# Patient Record
Sex: Female | Born: 1995 | Race: Black or African American | Hispanic: No | Marital: Single | Smoking: Never smoker
Health system: Southern US, Community
[De-identification: ages and names within clinical notes are randomized; demographics above are authoritative.]

## PROBLEM LIST (undated history)

## (undated) ENCOUNTER — Inpatient Hospital Stay (HOSPITAL_COMMUNITY): Payer: Self-pay

## (undated) DIAGNOSIS — D649 Anemia, unspecified: Secondary | ICD-10-CM

## (undated) DIAGNOSIS — R519 Headache, unspecified: Secondary | ICD-10-CM

## (undated) HISTORY — DX: Headache, unspecified: R51.9

## (undated) HISTORY — DX: Anemia, unspecified: D64.9

## (undated) HISTORY — PX: NO PAST SURGERIES: SHX2092

---

## 2017-06-26 ENCOUNTER — Emergency Department
Admission: EM | Admit: 2017-06-26 | Discharge: 2017-06-26 | Disposition: A | Discharge status: Home / Self Care | Payer: Medicaid Other | Attending: Emergency Medicine | Admitting: Emergency Medicine

## 2017-06-26 ENCOUNTER — Other Ambulatory Visit: Payer: Self-pay

## 2017-06-26 ENCOUNTER — Encounter: Payer: Self-pay | Admitting: Emergency Medicine

## 2017-06-26 DIAGNOSIS — K029 Dental caries, unspecified: Secondary | ICD-10-CM

## 2017-06-26 DIAGNOSIS — K0381 Cracked tooth: Secondary | ICD-10-CM | POA: Insufficient documentation

## 2017-06-26 DIAGNOSIS — K0889 Other specified disorders of teeth and supporting structures: Secondary | ICD-10-CM | POA: Diagnosis not present

## 2017-06-26 DIAGNOSIS — S025XXA Fracture of tooth (traumatic), initial encounter for closed fracture: Secondary | ICD-10-CM

## 2017-06-26 MED ORDER — IBUPROFEN 600 MG PO TABS: 600.0000 mg | ORAL_TABLET | Freq: Four times a day (QID) | ORAL | 0 refills | Status: DC | PRN

## 2017-06-26 MED ORDER — AMOXICILLIN 500 MG PO CAPS: 500.0000 mg | ORAL_CAPSULE | Freq: Three times a day (TID) | ORAL | 0 refills | Status: DC

## 2017-06-26 MED ORDER — LIDOCAINE VISCOUS 2 % MT SOLN: 10.0000 mL | OROMUCOSAL | 0 refills | Status: DC | PRN

## 2017-06-26 NOTE — ED Notes (Signed)
Pt discharged to home.  Family member driving.  Discharge instructions reviewed.  Verbalized understanding.  No questions or concerns at this time.  Teach back verified.  Pt in NAD.  No items left in ED.   

## 2017-06-26 NOTE — ED Triage Notes (Signed)
R upper dental pain began today.

## 2017-06-26 NOTE — ED Provider Notes (Signed)
[facility] Emergency Department Provider Note  ____________________________________________  Time seen: Approximately 3:17 PM  I have reviewed the triage vital signs and the nursing notes.   HISTORY  Chief Complaint Dental Pain    HPI Nancy Shaffer is a 21 y.o. female that presents to the emergency department for evaluation of right and left upper dental pain for 1 day.  Patient states that her left upper molar broke off yesterday. She was not eating anything when it happened.  No trauma.  [place] and does not have a dentist in the area.  She does not remember the last time she saw a dentist.  No fever, difficulty opening and closing mouth, drainage from eyes, nausea, vomiting.  History reviewed. No pertinent past medical history.  There are no active problems to display for this patient.   History reviewed. No pertinent surgical history.  Prior to Admission medications   Medication Sig Start Date End Date Taking? Authorizing Provider  amoxicillin (AMOXIL) 500 MG capsule Take 1 capsule (500 mg total) 3 (three) times daily by mouth. 06/26/17   Enid DerryWagner, Elgin Carn, PA-C  ibuprofen (ADVIL,MOTRIN) 600 MG tablet Take 1 tablet (600 mg total) every 6 (six) hours as needed by mouth. 06/26/17   Enid DerryWagner, Devell Parkerson, PA-C  lidocaine (XYLOCAINE) 2 % solution Use as directed 10 mLs as needed in the mouth or throat for mouth pain. 06/26/17   Enid DerryWagner, Suresh Audi, PA-C    Allergies Patient has no known allergies.  No family history on file.  Social History Social History   Tobacco Use  . Smoking status: Never Smoker  Substance Use Topics  . Alcohol use: Not on file  . Drug use: Not on file     Review of Systems  Constitutional: No fever/chills Cardiovascular: No chest pain. Respiratory: No SOB. Gastrointestinal: No abdominal pain.  No nausea, no vomiting.  Musculoskeletal: Negative for musculoskeletal pain. Skin: Negative for rash,  abrasions, lacerations, ecchymosis. Neurological: Negative for headaches, numbness or tingling   ____________________________________________   PHYSICAL EXAM:  VITAL SIGNS: ED Triage Vitals  Enc Vitals Group     BP 06/26/17 1415 106/71     Pulse Rate 06/26/17 1415 91     Resp 06/26/17 1415 (!) 8     Temp 06/26/17 1415 98.7 F (37.1 C)     Temp Source 06/26/17 1415 Oral     SpO2 06/26/17 1415 100 %     Weight 06/26/17 1416 183 lb (83 kg)     Height 06/26/17 1416 5\' 3"  (1.6 m)     Head Circumference --      Peak Flow --      Pain Score 06/26/17 1415 8     Pain Loc --      Pain Edu? --      Excl. in GC? --      Constitutional: Alert and oriented. Well appearing and in no acute distress. Eyes: Conjunctivae are normal. PERRL. EOMI. Head: Atraumatic. ENT:      Ears:      Nose: No congestion/rhinnorhea.      Mouth/Throat: Mucous membranes are moist.  Large cavities and fractures in upper right and left molars.  No tenderness to palpation around molars.  Drainage from sites.  No visible swelling.  No TMJ pain. Neck: No stridor.  Cardiovascular: Normal rate, regular rhythm.  Good peripheral circulation. Respiratory: Normal respiratory effort without tachypnea or retractions. Lungs CTAB. Good air entry to the bases with no decreased or absent breath  sounds. Musculoskeletal: Full range of motion to all extremities. No gross deformities appreciated. Neurologic:  Normal speech and language. No gross focal neurologic deficits are appreciated.  Skin:  Skin is warm, dry and intact. No rash noted.   ____________________________________________   LABS (all labs ordered are listed, but only abnormal results are displayed)  Labs Reviewed - No data to display ____________________________________________  EKG   ____________________________________________  RADIOLOGY   No results found.  ____________________________________________    PROCEDURES  Procedure(s) performed:     Procedures    Medications - No data to display   ____________________________________________   INITIAL IMPRESSION / ASSESSMENT AND PLAN / ED COURSE  Pertinent labs & imaging results that were available during my care of the patient were reviewed by me and considered in my medical decision making (see chart for details).  Review of the [place] CSRS was performed in accordance of the [facility] prior to dispensing any controlled drugs.   Patient presented to the emergency department for evaluation of dental pain for 1 day.  Vital signs and exam are reassuring.  Patient has visible dental decay and large fractures and upper molars.  Patient will be discharged home with prescriptions for amoxicillin, viscous lidocaine, ibuprofen.  Dental resources were provided.  Patient is to follow up with dentist as directed. Patient is given ED precautions to return to the ED for any worsening or new symptoms.     ____________________________________________  FINAL CLINICAL IMPRESSION(S) / ED DIAGNOSES  Final diagnoses:  Dental caries  Closed fracture of tooth, initial encounter      NEW MEDICATIONS STARTED DURING THIS VISIT:   This chart was dictated using voice recognition software/Dragon. Despite best efforts to proofread, errors can occur which can change the meaning. Any change was purely unintentional.    Enid DerryWagner, Elainah Rhyne, PA-C 06/26/17 1707    Dionne BucySiadecki, Sebastian, MD 06/27/17 [phone]

## 2017-06-26 NOTE — Discharge Instructions (Signed)
OPTIONS FOR DENTAL FOLLOW UP CARE ° °[place] Department of Health and Human Services - Local Safety Net Dental Clinics °http://www.ncdhhs.gov/dph/oralhealth/services/safetynetclinics.htm °  °[facility] (336-562-3123) ° °[facility] [place] (919-933-9087) ° °[facility] ([phone] ext [phone]) ° °[facility] ([phone]) ° °[facility] ([phone]) °This clinic caters to the indigent population and is on a lottery system. °Location: °[facility], [address], [place] °Clinic Hours: °Wednesdays from 6pm - 9pm, patients seen by a lottery system. °For dates, call or go to [email or web address].[facility].edu/[facility]/patients/Dental-[facility] °Services: °Cleanings, fillings and simple extractions. °Payment Options: °DENTAL WORK IS FREE OF CHARGE. Bring proof of income or support. °Best way to get seen: °Arrive at 5:15 pm - this is a lottery, NOT first come/first serve, so arriving earlier will not increase your chances of being seen. °  °  °[facility] °[phone] °Select option 1 for emergencies °  °Location: °[facility], [address], [place] °Clinic Hours: °No walk-ins accepted - call the day before to schedule an appointment. °Check in times are 9:30 am and 1:30 pm. °Services: °Simple extractions, temporary fillings, pulpectomy/pulp debridement, uncomplicated abscess drainage. °Payment Options: °PAYMENT IS DUE AT THE TIME OF SERVICE.  Fee is usually $100-200, additional surgical procedures (e.g. abscess drainage) may be extra. °Cash, checks, Visa/MasterCard accepted.  Can file Medicaid if patient is covered for dental - patient should call case worker to check. °No discount for [facility] patients. °Best way to get seen: °MUST call the day before and get onto the schedule. Can usually be seen the next 1-2 days. No walk-ins accepted. °  °  °[facility] °[phone] °   °Location: °[facility], [address], [place] °Clinic Hours: °M, W, Th, F 8am or 1:30pm, Tues 9a or 1:30 - first come/first served. °Services: °Simple extractions, temporary fillings, uncomplicated abscess drainage.  You do not need to be an [place] resident. °Payment Options: °PAYMENT IS DUE AT THE TIME OF SERVICE. °Dental insurance, otherwise sliding scale - bring proof of income or support. °Depending on income and treatment needed, cost is usually $50-200. °Best way to get seen: °Arrive early as it is first come/first served. °  °  °[facility] °[phone] °  °Location: °[address] °Clinic Hours: °Mon-Thu 8a-5p °Services: °Most basic dental services including extractions and fillings. °Payment Options: °PAYMENT IS DUE AT THE TIME OF SERVICE. °Sliding scale, up to 50% off - bring proof if income or support. °Medicaid with dental option accepted. °Best way to get seen: °Call to schedule an appointment, can usually be seen within 2 weeks OR they will try to see walk-ins - show up at 8a or 2p (you may have to wait). °  °  °[facility] °919-245-2435 °[place] RESIDENTS ONLY °  °Location: °[facility], [address], [place], [place] [address] °Clinic Hours: By appointment only. °Monday - Thursday 8am-5pm, Friday 8am-12pm °Services: Cleanings, fillings, extractions. °Payment Options: °PAYMENT IS DUE AT THE TIME OF SERVICE. °Cash, Visa or MasterCard. Sliding scale - $30 minimum per service. °Best way to get seen: °Come in to office, complete packet and make an appointment - need proof of income °or support monies for each household member and proof of [place] residence. °Usually takes about a month to get in. °  °  °[facility] °[phone] °  °Location: °[address]   [place] °Clinic Hours: Walk-in Urgent Care Dental Services are offered Monday-Friday  mornings only. °The numbers of emergencies accepted daily is limited to the number of °providers available. °Maximum 15 - Mondays, Wednesdays & Thursdays °Maximum 10 - Tuesdays & Fridays °Services: °You do not need to be a [place] resident to be seen for a dental emergency. °Emergencies are defined as pain, swelling, abnormal bleeding, or dental trauma. Walkins will receive x-rays if needed. °NOTE: Dental cleaning is not an emergency. °Payment Options: °PAYMENT IS DUE AT THE TIME OF SERVICE. °Minimum co-pay is $40.00 for uninsured patients. °Minimum co-pay is $3.00 for Medicaid with dental coverage. °Dental Insurance is accepted and must be presented at time of visit. °Medicare does not cover dental. °Forms of payment: Cash, credit card, checks. °Best way to get seen: °If not previously registered with the clinic, walk-in dental registration begins at 7:15 am and is on a first come/first serve basis. °If previously registered with the clinic, call to make an appointment. °  °  °[facility] °[phone] °[place] RESIDENTS ONLY °  °Location: °[address], [place], [place] °Clinic Hours: °Mon-Thu 10a-2p °Services: Extractions only! °Payment Options: °FREE (donations accepted) - bring proof of income or support °Best way to get seen: °Call and schedule an appointment OR come at 8am on the 1st Monday of every month (except for holidays) when it is first come/first served. °  °  °[facility] °[phone] °  °Location: °[address], [place] °Clinic Hours: °Friday mornings °Services, Payment Options, Best way to get seen: °Call for info °

## 2017-06-26 NOTE — ED Notes (Signed)
EDP at bedside  

## 2017-09-21 DIAGNOSIS — R2242 Localized swelling, mass and lump, left lower limb: Secondary | ICD-10-CM | POA: Diagnosis present

## 2017-09-21 DIAGNOSIS — L02416 Cutaneous abscess of left lower limb: Secondary | ICD-10-CM | POA: Insufficient documentation

## 2017-09-22 ENCOUNTER — Encounter: Payer: Self-pay | Admitting: Emergency Medicine

## 2017-09-22 ENCOUNTER — Emergency Department
Admission: EM | Admit: 2017-09-22 | Discharge: 2017-09-22 | Disposition: A | Discharge status: Home / Self Care | Payer: Medicaid Other | Attending: Emergency Medicine | Admitting: Emergency Medicine

## 2017-09-22 ENCOUNTER — Other Ambulatory Visit: Payer: Self-pay

## 2017-09-22 DIAGNOSIS — L02416 Cutaneous abscess of left lower limb: Secondary | ICD-10-CM

## 2017-09-22 LAB — GLUCOSE, CAPILLARY: GLUCOSE-CAPILLARY: 86 mg/dL (ref 65–99)

## 2017-09-22 MED ORDER — LIDOCAINE-EPINEPHRINE-TETRACAINE (LET) SOLUTION
NASAL | Status: AC
  Filled 2017-09-22: qty 3

## 2017-09-22 MED ORDER — TRAMADOL HCL 50 MG PO TABS: 50.0000 mg | ORAL_TABLET | Freq: Four times a day (QID) | ORAL | 0 refills | Status: DC | PRN

## 2017-09-22 MED ORDER — BACITRACIN ZINC 500 UNIT/GM EX OINT
TOPICAL_OINTMENT | CUTANEOUS | Status: AC
  Filled 2017-09-22: qty 0.9

## 2017-09-22 MED ORDER — OXYCODONE-ACETAMINOPHEN 5-325 MG PO TABS
ORAL_TABLET | ORAL | Status: AC
  Filled 2017-09-22: qty 1

## 2017-09-22 MED ORDER — LIDOCAINE HCL (PF) 1 % IJ SOLN
INTRAMUSCULAR | Status: AC
  Filled 2017-09-22: qty 5

## 2017-09-22 MED ORDER — OXYCODONE-ACETAMINOPHEN 5-325 MG PO TABS
1.0000 | ORAL_TABLET | Freq: Once | ORAL | Status: AC
  Administered 2017-09-22: 1 via ORAL

## 2017-09-22 MED ORDER — LIDOCAINE-EPINEPHRINE-TETRACAINE (LET) SOLUTION
3.0000 mL | Freq: Once | NASAL | Status: AC
  Administered 2017-09-22: 01:00:00 3 mL via TOPICAL

## 2017-09-22 MED ORDER — SULFAMETHOXAZOLE-TRIMETHOPRIM 800-160 MG PO TABS
ORAL_TABLET | ORAL | Status: AC
  Filled 2017-09-22: qty 1

## 2017-09-22 MED ORDER — SULFAMETHOXAZOLE-TRIMETHOPRIM 800-160 MG PO TABS: 1.0000 | ORAL_TABLET | Freq: Two times a day (BID) | ORAL | 0 refills | Status: AC

## 2017-09-22 MED ORDER — SULFAMETHOXAZOLE-TRIMETHOPRIM 800-160 MG PO TABS
1.0000 | ORAL_TABLET | Freq: Once | ORAL | Status: AC
  Administered 2017-09-22: 1 via ORAL

## 2017-09-22 NOTE — ED Triage Notes (Signed)
Patient ambulatory to triage with steady gait, without difficulty or distress noted; pt reports boil to inside left upper thigh since Friday

## 2017-09-22 NOTE — ED Provider Notes (Signed)
[facility] Emergency Department Provider Note   First MD Initiated Contact with Patient 09/22/17 0025     (approximate)  I have reviewed the triage vital signs and the nursing notes.   HISTORY  Chief Complaint Abscess    HPI Nancy Shaffer is a 22 y.o. female with no past medical history presents to the emergency department with 4-day history of left thigh "boil" with current 8 out of 10 pain.  Patient states pain is aggravated with any palpation of the area.  Patient denies any fever afebrile on presentation with a temperature 98.7.   Past medical history None There are no active problems to display for this patient.  Past surgical history None  Prior to Admission medications   Medication Sig Start Date End Date Taking? Authorizing Provider  amoxicillin (AMOXIL) 500 MG capsule Take 1 capsule (500 mg total) 3 (three) times daily by mouth. 06/26/17   Enid DerryWagner, Ashley, PA-C  ibuprofen (ADVIL,MOTRIN) 600 MG tablet Take 1 tablet (600 mg total) every 6 (six) hours as needed by mouth. 06/26/17   Enid DerryWagner, Ashley, PA-C  lidocaine (XYLOCAINE) 2 % solution Use as directed 10 mLs as needed in the mouth or throat for mouth pain. 06/26/17   Enid DerryWagner, Ashley, PA-C  sulfamethoxazole-trimethoprim (BACTRIM DS,SEPTRA DS) 800-160 MG tablet Take 1 tablet by mouth 2 (two) times daily for 9 days. 09/22/17 10/01/17  Darci CurrentBrown, [place] N, MD  traMADol (ULTRAM) 50 MG tablet Take 1 tablet (50 mg total) by mouth every 6 (six) hours as needed. 09/22/17 09/22/18  Darci CurrentBrown, Echo N, MD    Allergies No known drug allergies No family history on file.  Social History Social History   Tobacco Use  . Smoking status: Never Smoker  . Smokeless tobacco: Never Used  Substance Use Topics  . Alcohol use: Not on file  . Drug use: Not on file    Review of Systems Constitutional: No fever/chills Eyes: No visual changes. ENT: No sore throat. Cardiovascular: Denies chest pain. Respiratory:  Denies shortness of breath. Gastrointestinal: No abdominal pain.  No nausea, no vomiting.  No diarrhea.  No constipation. Genitourinary: Negative for dysuria. Musculoskeletal: Negative for neck pain.  Negative for back pain. Integumentary: Negative for rash.  Positive for left thigh boil neurological: Negative for headaches, focal weakness or numbness.   ____________________________________________   PHYSICAL EXAM:  VITAL SIGNS: ED Triage Vitals  Enc Vitals Group     BP 09/22/17 0009 127/80     Pulse Rate 09/22/17 0009 85     Resp 09/22/17 0009 18     Temp 09/22/17 0009 98.7 F (37.1 C)     Temp Source 09/22/17 0009 Oral     SpO2 09/22/17 0009 99 %     Weight 09/22/17 0008 84.4 kg (186 lb)     Height 09/22/17 0008 1.626 m (5\' 4" )     Head Circumference --      Peak Flow --      Pain Score 09/22/17 0008 8     Pain Loc --      Pain Edu? --      Excl. in GC? --     Constitutional: Alert and oriented. Well appearing and in no acute distress. Eyes: Conjunctivae are normal.  Head: Atraumatic. Mouth/Throat: Mucous membranes are moist.  Oropharynx non-erythematous. Neck: No stridor.   Cardiovascular: Normal rate, regular rhythm. Good peripheral circulation. Grossly normal heart sounds. Respiratory: Normal respiratory effort.  No retractions. Lungs CTAB. Gastrointestinal: Soft and nontender. No distention.  Musculoskeletal: No lower extremity tenderness nor edema. No gross deformities of extremities. Neurologic:  Normal speech and language. No gross focal neurologic deficits are appreciated.  Skin: Abscess noted proximal medial left thigh with 8 x 7 area of surrounding blanching erythema consistent with cellulitis. Psychiatric: Mood and affect are normal. Speech and behavior are normal.     ..Incision and Drainage Date/Time: 09/22/2017 1:45 AM Performed by: Darci Current, MD Authorized by: Darci Current, MD   Consent:    Consent obtained:  Verbal   Consent given  by:  Patient   Risks discussed:  Bleeding, infection, pain and incomplete drainage   Alternatives discussed:  Alternative treatment Location:    Type:  Abscess   Location:  Lower extremity   Lower extremity location:  Leg   Leg location:  L upper leg Pre-procedure details:    Skin preparation:  Betadine Anesthesia (see MAR for exact dosages):    Anesthesia method:  Local infiltration and topical application   Topical anesthetic:  LET   Local anesthetic:  Lidocaine 1% w/o epi Procedure type:    Complexity:  Simple Procedure details:    Needle aspiration: no     Incision types:  Cruciate   Incision depth:  Subcutaneous   Scalpel blade:  11   Drainage:  Purulent   Drainage amount:  Moderate   Wound treatment:  Wound left open   Packing materials:  None Post-procedure details:    Patient tolerance of procedure:  Tolerated well, no immediate complications     ____________________________________________   INITIAL IMPRESSION / ASSESSMENT AND PLAN / ED COURSE  As part of my medical decision making, I reviewed the following data within the electronic MEDICAL RECORD NUMBER14 year old female 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  Patient given Percocet for pain.  Patient also given Bactrim DS.  Abscess I indeed with moderate purulent drainage expressed.  Patient will be prescribed Bactrim for home and tramadol for pain.  Patient is advised not to drive or operate machinery while taking tramadol.  In addition patient was advised of warning signs that would warrant immediate return to the emergency department. ____________________________________________  FINAL CLINICAL IMPRESSION(S) / ED DIAGNOSES  Final diagnoses:  Abscess of left thigh     MEDICATIONS GIVEN DURING THIS VISIT:  Medications  sulfamethoxazole-trimethoprim (BACTRIM DS,SEPTRA DS) 800-160 MG per tablet (not administered)  oxyCODONE-acetaminophen  (PERCOCET/ROXICET) 5-325 MG per tablet (not administered)  lidocaine-EPINEPHrine-tetracaine (LET) solution (not administered)  lidocaine (PF) (XYLOCAINE) 1 % injection (not administered)  bacitracin 500 UNIT/GM ointment (not administered)  oxyCODONE-acetaminophen (PERCOCET/ROXICET) 5-325 MG per tablet 1 tablet (1 tablet Oral Given 09/22/17 0039)  sulfamethoxazole-trimethoprim (BACTRIM DS,SEPTRA DS) 800-160 MG per tablet 1 tablet (1 tablet Oral Given 09/22/17 0039)  lidocaine-EPINEPHrine-tetracaine (LET) solution (3 mLs Topical Given 09/22/17 0039)     ED Discharge Orders        Ordered    sulfamethoxazole-trimethoprim (BACTRIM DS,SEPTRA DS) 800-160 MG tablet  2 times daily     09/22/17 0129    traMADol (ULTRAM) 50 MG tablet  Every 6 hours PRN     09/22/17 0129       Note:  This document was prepared using Dragon voice recognition software and may include unintentional dictation errors.    Darci Current, MD 09/22/17 [phone]

## 2018-01-07 ENCOUNTER — Encounter: Payer: Self-pay | Admitting: Emergency Medicine

## 2018-01-07 ENCOUNTER — Emergency Department
Admission: EM | Admit: 2018-01-07 | Discharge: 2018-01-07 | Disposition: A | Discharge status: Home / Self Care | Payer: Medicaid Other | Attending: Emergency Medicine | Admitting: Emergency Medicine

## 2018-01-07 ENCOUNTER — Other Ambulatory Visit: Payer: Self-pay

## 2018-01-07 DIAGNOSIS — O9989 Other specified diseases and conditions complicating pregnancy, childbirth and the puerperium: Secondary | ICD-10-CM | POA: Diagnosis present

## 2018-01-07 DIAGNOSIS — R51 Headache: Secondary | ICD-10-CM | POA: Diagnosis not present

## 2018-01-07 DIAGNOSIS — R519 Headache, unspecified: Secondary | ICD-10-CM

## 2018-01-07 DIAGNOSIS — Z3A01 Less than 8 weeks gestation of pregnancy: Secondary | ICD-10-CM | POA: Insufficient documentation

## 2018-01-07 DIAGNOSIS — O26891 Other specified pregnancy related conditions, first trimester: Secondary | ICD-10-CM | POA: Diagnosis not present

## 2018-01-07 MED ORDER — ACETAMINOPHEN 325 MG PO TABS
650.0000 mg | ORAL_TABLET | Freq: Once | ORAL | Status: AC
Start: 2018-01-07 — End: 2018-01-07
  Administered 2018-01-07: 650 mg via ORAL
  Filled 2018-01-07: qty 2

## 2018-01-07 NOTE — ED Triage Notes (Signed)
First Nurse NOte:  Arrives with c/o headache x 3-4 days.  Patient states she is about [redacted] weeks pregnant and isn't sure what medications she can take.  AAOx3.  Skin warm and dry.  MAE equally and strong.  Posture upright and relaxed.  Gait steady. NAD

## 2018-01-07 NOTE — ED Triage Notes (Signed)
Pt to ED via POV c/o headache x 3-4 days. Pt is approximately [redacted] weeks pregnant. Pt is in NAD at this time.

## 2018-01-07 NOTE — ED Provider Notes (Signed)
[facility] Emergency Department Provider Note  ____________________________________________  Time seen: Approximately 6:27 PM  I have reviewed the triage vital signs and the nursing notes.   HISTORY  Chief Complaint Headache   HPI Nancy Shaffer is a 22 y.o. female who presents to the emergency department for treatment and evaluation of headache. She is about [redacted] weeks pregnant and has had a generalized headache for the past 3 days. 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 She has not taken anything for headache because she couldn't remember what she could take while pregnant. She denies head injury or other attributing factors. G2P1  History reviewed. No pertinent past medical history.  There are no active problems to display for this patient.   History reviewed. No pertinent surgical history.  Prior to Admission medications   Not on File    Allergies Patient has no known allergies.  No family history on file.  Social History Social History   Tobacco Use  . Smoking status: Never Smoker  . Smokeless tobacco: Never Used  Substance Use Topics  . Alcohol use: Not Currently  . Drug use: Not Currently    Review of Systems Constitutional: Negative for fever. ENT: Negative for sore throat. Respiratory: Negative for cough or shortness of breath Gastrointestinal: No abdominal pain.  No nausea, no vomiting.  No diarrhea.  Musculoskeletal: Negative for generalized body aches. Skin: Negative for rash/lesion/wound. Neurological: Positive for headaches, negative for focal weakness or numbness.  ____________________________________________   PHYSICAL EXAM:  VITAL SIGNS: ED Triage Vitals  Enc Vitals Group     BP 01/07/18 1738 122/75     Pulse Rate 01/07/18 1738 85     Resp 01/07/18 1738 16     Temp 01/07/18 1738 98.2 F (36.8 C)     Temp Source 01/07/18 1738 Oral     SpO2 01/07/18 1738 100 %     Weight  01/07/18 1739 176 lb (79.8 kg)     Height --      Head Circumference --      Peak Flow --      Pain Score 01/07/18 1738 9     Pain Loc --      Pain Edu? --      Excl. in GC? --     Constitutional: Alert and oriented. Well appearing and in no acute distress. Eyes: Conjunctivae are normal. PERRL. EOMI. Head: Atraumatic. Nose: No congestion/rhinnorhea. Mouth/Throat: Mucous membranes are moist. Neck: No stridor.  Cardiovascular: Normal rate, regular rhythm. Good peripheral circulation. Respiratory: Normal respiratory effort. Musculoskeletal: Full ROM throughout.  Neurologic:  Normal speech and language. No gross focal neurologic deficits are appreciated. Speech is normal. No gait instability. Negative Rhomberg, negative finger-nose-finger testing, negative heel to shin exam. Skin:  Skin is warm, dry and intact. No rash noted. Psychiatric: Mood and affect are normal. Speech and behavior are normal.  ____________________________________________   LABS (all labs ordered are listed, but only abnormal results are displayed)  Labs Reviewed - No data to display ____________________________________________  EKG  Not indicated ____________________________________________  RADIOLOGY  Not indicated ____________________________________________   PROCEDURES  None ____________________________________________   INITIAL IMPRESSION / ASSESSMENT AND PLAN / ED COURSE     Pertinent labs & imaging results that were available during my care of the patient were reviewed by me and considered in my medical decision making (see chart for details).  Patient was advised to take tylenol every 4-6 hours as needed  for headache and follow up with the OB GYN. She was also encouraged to stay well hydrated. ____________________________________________   FINAL CLINICAL IMPRESSION(S) / ED DIAGNOSES  Final diagnoses:  Pregnancy headache in first trimester       Chinita Pester,  FNP 01/07/18 1832    Jeanmarie Plant, MD 01/08/18 [phone]

## 2018-01-20 ENCOUNTER — Inpatient Hospital Stay (HOSPITAL_COMMUNITY)
Admission: AD | Admit: 2018-01-20 | Discharge: 2018-01-20 | Disposition: A | Discharge status: Home / Self Care | Payer: Medicaid Other | Source: Ambulatory Visit | Attending: Obstetrics & Gynecology | Admitting: Obstetrics & Gynecology

## 2018-01-20 ENCOUNTER — Encounter (HOSPITAL_COMMUNITY): Payer: Self-pay

## 2018-01-20 ENCOUNTER — Other Ambulatory Visit: Payer: Self-pay

## 2018-01-20 DIAGNOSIS — Z3A08 8 weeks gestation of pregnancy: Secondary | ICD-10-CM | POA: Diagnosis not present

## 2018-01-20 DIAGNOSIS — O219 Vomiting of pregnancy, unspecified: Secondary | ICD-10-CM

## 2018-01-20 DIAGNOSIS — R55 Syncope and collapse: Secondary | ICD-10-CM | POA: Diagnosis present

## 2018-01-20 DIAGNOSIS — O26891 Other specified pregnancy related conditions, first trimester: Secondary | ICD-10-CM | POA: Diagnosis not present

## 2018-01-20 LAB — CBC
HCT: 36.1 % (ref 36.0–46.0)
Hemoglobin: 12 g/dL (ref 12.0–15.0)
MCH: 27.9 pg (ref 26.0–34.0)
MCHC: 33.2 g/dL (ref 30.0–36.0)
MCV: 84 fL (ref 78.0–100.0)
PLATELETS: 365 10*3/uL (ref 150–400)
RBC: 4.3 MIL/uL (ref 3.87–5.11)
RDW: 14.6 % (ref 11.5–15.5)
WBC: 8.7 10*3/uL (ref 4.0–10.5)

## 2018-01-20 LAB — COMPREHENSIVE METABOLIC PANEL
ALBUMIN: 4 g/dL (ref 3.5–5.0)
ALT: 10 U/L — AB (ref 14–54)
AST: 16 U/L (ref 15–41)
Alkaline Phosphatase: 56 U/L (ref 38–126)
Anion gap: 7 (ref 5–15)
BUN: 5 mg/dL — AB (ref 6–20)
CHLORIDE: 109 mmol/L (ref 101–111)
CO2: 21 mmol/L — AB (ref 22–32)
CREATININE: 0.75 mg/dL (ref 0.44–1.00)
Calcium: 8.7 mg/dL — ABNORMAL LOW (ref 8.9–10.3)
GFR calc Af Amer: >60 mL/min (ref >60)
GFR calc non Af Amer: >60 mL/min (ref >60)
Glucose, Bld: 85 mg/dL (ref 65–99)
POTASSIUM: 4.4 mmol/L (ref 3.5–5.1)
SODIUM: 137 mmol/L (ref 135–145)
Total Bilirubin: 0.9 mg/dL (ref 0.3–1.2)
Total Protein: 7.7 g/dL (ref 6.5–8.1)

## 2018-01-20 LAB — URINALYSIS, ROUTINE W REFLEX MICROSCOPIC
Bilirubin Urine: NEGATIVE
GLUCOSE, UA: NEGATIVE mg/dL
Hgb urine dipstick: NEGATIVE
KETONES UR: NEGATIVE mg/dL
Nitrite: NEGATIVE
PROTEIN: NEGATIVE mg/dL
Specific Gravity, Urine: 1.024 (ref 1.005–1.030)
pH: 6 (ref 5.0–8.0)

## 2018-01-20 LAB — GLUCOSE, CAPILLARY: GLUCOSE-CAPILLARY: 101 mg/dL — AB (ref 65–99)

## 2018-01-20 LAB — ABO/RH: ABO/RH(D): A POS

## 2018-01-20 LAB — HCG, QUANTITATIVE, PREGNANCY: HCG, BETA CHAIN, QUANT, S: 132077 m[IU]/mL — AB (ref <5)

## 2018-01-20 LAB — POCT PREGNANCY, URINE: Preg Test, Ur: POSITIVE — AB

## 2018-01-20 MED ORDER — METOCLOPRAMIDE HCL 10 MG PO TABS: 10.0000 mg | ORAL_TABLET | Freq: Three times a day (TID) | ORAL | 0 refills | Status: DC | PRN

## 2018-01-20 NOTE — MAU Provider Note (Signed)
History     CSN: [id number]  Arrival date and time: 01/20/18 1129   First Provider Initiated Contact with Patient 01/20/18 1202      Chief Complaint  Patient presents with  . Loss of Consciousness   HPI Nancy Shaffer is a 22 y.o. G2P1000 at [redacted]w[redacted]d who presents via EMS for syncopal episode. Incident occurred this morning while at work. 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 States she started to feel dizzy then passed out. Was out a for a few seconds. Came to quickly. States this has happened once before when she was 22 yrs old. No cardiac hx. This morning she had a banana muffin and some ginger ale. Not drinking water due to nausea. Has not vomited today. Denies abdominal pain or vaginal bleeding. Receives prenatal care in [place].   OB History    Gravida  2   Para  1   Term  1   Preterm      AB      Living        SAB      TAB      Ectopic      Multiple      Live Births              History reviewed. No pertinent past medical history.  History reviewed. No pertinent surgical history.  Family History  Problem Relation Age of Onset  . Sickle cell anemia Brother   . Diabetes Paternal Grandfather     Social History   Tobacco Use  . Smoking status: Never Smoker  . Smokeless tobacco: Never Used  Substance Use Topics  . Alcohol use: Not Currently  . Drug use: Not Currently    Allergies: No Known Allergies  No medications prior to admission.    Review of Systems  Constitutional: Negative.   Respiratory: Negative.   Cardiovascular: Negative.   Gastrointestinal: Positive for nausea. Negative for abdominal pain and vomiting.  Genitourinary: Negative.   Neurological: Positive for syncope. Negative for headaches.   Physical Exam   Blood pressure 121/75, pulse 86, temperature 98.3 F (36.8 C), temperature source Oral, resp. rate 16, height 5\' 4"  (1.626 m), weight 176 lb (79.8 kg), last menstrual  period 11/25/2017, SpO2 100 %.  Physical Exam  Nursing note and vitals reviewed. Constitutional: She is oriented to person, place, and time. She appears well-developed and well-nourished. No distress.  HENT:  Head: Normocephalic and atraumatic.  Eyes: Conjunctivae are normal. Right eye exhibits no discharge. Left eye exhibits no discharge. No scleral icterus.  Neck: Normal range of motion.  Cardiovascular: Normal rate, regular rhythm and normal heart sounds.  No murmur heard. Respiratory: Effort normal and breath sounds normal. No respiratory distress. She has no wheezes.  GI: Soft. Bowel sounds are normal. She exhibits no distension. There is no tenderness. There is no rebound and no guarding.  Neurological: She is alert and oriented to person, place, and time.  Skin: Skin is warm and dry. She is not diaphoretic.  Psychiatric: She has a normal mood and affect. Her behavior is normal. Judgment and thought content normal.    MAU Course  Procedures Results for orders placed or performed during the hospital encounter of 01/20/18 (from the past 24 hour(s))  Urinalysis, Routine w reflex microscopic     Status: Abnormal   Collection Time: 01/20/18 11:34 AM  Result Value Ref Range   Color, Urine YELLOW YELLOW   APPearance HAZY (  A) CLEAR   Specific Gravity, Urine 1.024 1.005 - 1.030   pH 6.0 5.0 - 8.0   Glucose, UA NEGATIVE NEGATIVE mg/dL   Hgb urine dipstick NEGATIVE NEGATIVE   Bilirubin Urine NEGATIVE NEGATIVE   Ketones, ur NEGATIVE NEGATIVE mg/dL   Protein, ur NEGATIVE NEGATIVE mg/dL   Nitrite NEGATIVE NEGATIVE   Leukocytes, UA SMALL (A) NEGATIVE   RBC / HPF 0-5 0 - 5 RBC/hpf   WBC, UA 11-20 0 - 5 WBC/hpf   Bacteria, UA RARE (A) NONE SEEN   Squamous Epithelial / LPF 11-20 0 - 5   Mucus PRESENT   Pregnancy, urine POC     Status: Abnormal   Collection Time: 01/20/18 11:55 AM  Result Value Ref Range   Preg Test, Ur POSITIVE (A) NEGATIVE  Glucose, capillary     Status: Abnormal    Collection Time: 01/20/18 11:59 AM  Result Value Ref Range   Glucose-Capillary 101 (H) 65 - 99 mg/dL  CBC     Status: None   Collection Time: 01/20/18 12:09 PM  Result Value Ref Range   WBC 8.7 4.0 - 10.5 K/uL   RBC 4.30 3.87 - 5.11 MIL/uL   Hemoglobin 12.0 12.0 - 15.0 g/dL   HCT 62.136.1 30.836.0 - 65.746.0 %   MCV 84.0 78.0 - 100.0 fL   MCH 27.9 26.0 - 34.0 pg   MCHC 33.2 30.0 - 36.0 g/dL   RDW 84.614.6 96.211.5 - 95.215.5 %   Platelets 365 150 - 400 K/uL  Comprehensive metabolic panel     Status: Abnormal   Collection Time: 01/20/18 12:09 PM  Result Value Ref Range   Sodium 137 135 - 145 mmol/L   Potassium 4.4 3.5 - 5.1 mmol/L   Chloride 109 101 - 111 mmol/L   CO2 21 (L) 22 - 32 mmol/L   Glucose, Bld 85 65 - 99 mg/dL   BUN 5 (L) 6 - 20 mg/dL   Creatinine, Ser 8.410.75 0.44 - 1.00 mg/dL   Calcium 8.7 (L) 8.9 - 10.3 mg/dL   Total Protein 7.7 6.5 - 8.1 g/dL   Albumin 4.0 3.5 - 5.0 g/dL   AST 16 15 - 41 U/L   ALT 10 (L) 14 - 54 U/L   Alkaline Phosphatase 56 38 - 126 U/L   Total Bilirubin 0.9 0.3 - 1.2 mg/dL   GFR calc non Af Amer >60 >60 mL/min   GFR calc Af Amer >60 >60 mL/min   Anion gap 7 5 - 15  hCG, quantitative, pregnancy     Status: Abnormal   Collection Time: 01/20/18 12:09 PM  Result Value Ref Range   hCG, Beta Chain, Quant, S 132,077 (H) <5 mIU/mL  ABO/Rh     Status: None   Collection Time: 01/20/18 12:11 PM  Result Value Ref Range   ABO/RH(D)      A POS Performed at [facility], [address] [place], [place] [id number]     MDM VSS, NAD EKG NSR Not orthostatic. Labs reassuring.  Abdomen soft and non tender.   Assessment and Plan  A: 1. Syncope, unspecified syncope type   2. [redacted] weeks gestation of pregnancy   3. Nausea and vomiting during pregnancy prior to [redacted] weeks gestation    P: Discharge home Increase water intake, especially while at work Rx reglan Eat frequent small meals F/u with OB  Judeth HornErin Otie Headlee 01/20/2018, 12:02 PM

## 2018-01-20 NOTE — Discharge Instructions (Signed)
Vasovagal Syncope, Adult  Syncope, which is commonly known as fainting or passing out, is a temporary loss of consciousness. It occurs when the blood flow to the brain is reduced. Vasovagal syncope, also called neurocardiogenic syncope, is a fainting spell that happens when blood flow to the brain is reduced because of a sudden drop in heart rate and blood pressure.  Vasovagal syncope is usually harmless. However, you can get injured if you fall during a fainting spell.  What are the causes?  This condition is caused by a drop in heart rate and blood pressure, usually in response to a trigger. Many things and situations can trigger an episode, including:  · Pain.  · Fear.  · The sight of blood. This may occur during medical procedures, such as when blood is being drawn from a vein.  · Common activities, such as coughing, swallowing, stretching, or going to the bathroom.  · Emotional stress.  · Being in a confined space.  · Prolonged standing, especially in a warm environment.  · Lack of sleep or rest.  · Not eating for a long time.  · Not drinking enough liquids.  · Recent illness.  · Drinking alcohol.  · Taking drugs that affect blood pressure, such as marijuana, cocaine, opiates, or inhalants.    What are the signs or symptoms?  Before a fainting episode, you may:  · Feel dizzy or light-headed.  · Become pale.  · Sense that you are going to faint.  · Feel like the room is spinning.  · Only see directly ahead (tunnel vision).  · Feel sick to your stomach (nauseous).  · See spots.  · Slowly lose vision.  · Hear ringing in your ears.  · Have a headache.  · Feel warm and sweaty.  · Feel a sensation of pins and needles.    During the fainting spell, you may twitch or make jerky movements. Fainting spells usually last no longer than a few minutes before you wake up. If you get up too quickly before your body can recover, you may faint again.  How is this diagnosed?  This condition is diagnosed based on your symptoms,  your medical history, and a physical exam. Tests may be done to rule out other causes of fainting. Tests may include:  · Blood tests.  · Heart tests, such as an electrocardiogram (ECG), echocardiogram, or electrophysiology study.  · A test to check your response to changes in position (tilt table test).    How is this treated?  Usually, treatment is not needed for this condition. Your health care provider may suggest ways to help prevent fainting episodes. These may include:  · Drinking additional fluids if you are exposed to a trigger.  · Sitting or lying down if you notice signs that an episode is coming.    If your fainting spells continue, your health care provider may recommend that you:  · Take medicines to prevent fainting or to help reduce further episodes of fainting.  · Do certain exercises.  · Wear compression stockings.  · Have surgery to place a pacemaker in your body (rare).    Follow these instructions at home:  · Learn to identify the signs that an episode is coming.  · Sit or lie down at the first sign of a fainting spell. If you sit down, put your head down between your legs. If you lie down, swing your legs up in the air to increase blood flow to the brain.  ·   Avoid hot tubs and saunas.  · Avoid standing for a long time. If you have to stand for a long time, try:  ? Crossing your legs.  ? Flexing and stretching your leg muscles.  ? Squatting.  ? Moving your legs.  ? Bending over.  · Drink enough fluid to keep your urine clear or pale yellow.  · Make changes to your diet that your health care provider recommends. You may be told to:  ? Avoid caffeine.  ? Eat more salt.  · Take over-the-counter and prescription medicines only as told by your health care provider.  Contact a health care provider if:  · You continue to have fainting spells despite treatment.  · You faint more often despite treatment.  · You lose consciousness for more than a few minutes.  · You faint during or after exercising or  after being startled.  · You have twitching or jerky movements for longer than a few seconds during a fainting spell.  · You have an episode of twitching or jerky movements without fainting.  Get help right away if:  · A fainting spell leads to an injury or bleeding.  · You have new symptoms that occur with the fainting spells, such as:  ? Shortness of breath.  ? Chest pain.  ? Irregular heartbeat.  · You twitch or make jerky movements for more than 5 minutes.  · You twitch or make jerky movements during more than one fainting spell.  This information is not intended to replace advice given to you by your health care provider. Make sure you discuss any questions you have with your health care provider.  Document Released: 07/20/2012 Document Revised: 01/15/2016 Document Reviewed: 06/01/2015  Elsevier Interactive Patient Education © 2018 [employer]

## 2018-01-25 ENCOUNTER — Other Ambulatory Visit: Payer: Self-pay | Admitting: Nurse Practitioner

## 2018-01-25 DIAGNOSIS — Z369 Encounter for antenatal screening, unspecified: Secondary | ICD-10-CM

## 2018-01-25 LAB — OB RESULTS CONSOLE HIV ANTIBODY (ROUTINE TESTING): HIV: NONREACTIVE

## 2018-01-25 LAB — HM PAP SMEAR: HM Pap smear: NEGATIVE

## 2018-01-26 LAB — OB RESULTS CONSOLE RPR: RPR: NONREACTIVE

## 2018-01-26 LAB — OB RESULTS CONSOLE HEPATITIS B SURFACE ANTIGEN: Hepatitis B Surface Ag: NEGATIVE

## 2018-02-24 ENCOUNTER — Ambulatory Visit (HOSPITAL_BASED_OUTPATIENT_CLINIC_OR_DEPARTMENT_OTHER)
Admission: RE | Admit: 2018-02-24 | Discharge: 2018-02-24 | Disposition: A | Discharge status: Home / Self Care | Payer: Medicaid Other | Source: Ambulatory Visit | Attending: Maternal & Fetal Medicine | Admitting: Maternal & Fetal Medicine

## 2018-02-24 ENCOUNTER — Ambulatory Visit
Admission: RE | Admit: 2018-02-24 | Discharge: 2018-02-24 | Disposition: A | Discharge status: Home / Self Care | Payer: Medicaid Other | Source: Ambulatory Visit | Attending: Maternal & Fetal Medicine | Admitting: Maternal & Fetal Medicine

## 2018-02-24 VITALS — BP 112/67 | HR 71 | Temp 98.0°F | Resp 16 | Wt 171.4 lb

## 2018-02-24 DIAGNOSIS — Z369 Encounter for antenatal screening, unspecified: Secondary | ICD-10-CM

## 2018-02-24 DIAGNOSIS — Z832 Family history of diseases of the blood and blood-forming organs and certain disorders involving the immune mechanism: Secondary | ICD-10-CM | POA: Diagnosis not present

## 2018-02-24 NOTE — Progress Notes (Addendum)
Referring physician:  [facility] Length of Consultation: 40 minutes   Nancy Shaffer  was referred to [facility] for genetic counseling to review prenatal screening and testing options and her family history of sickle cell disease.  This note summarizes the information we discussed.    We offered the following routine screening tests for this pregnancy:  First trimester screening, which includes nuchal translucency ultrasound screen and first trimester maternal serum marker screening.  The nuchal translucency has approximately an 80% detection rate for Down syndrome and can be positive for other chromosome abnormalities as well as congenital heart defects.  When combined with a maternal serum marker screening, the detection rate is up to 90% for Down syndrome and up to 97% for trisomy 18.     Maternal serum marker screening, a blood test that measures pregnancy proteins, can provide risk assessments for Down syndrome, trisomy 18, and open neural tube defects (spina bifida, anencephaly). Because it does not directly examine the fetus, it cannot positively diagnose or rule out these problems.  Targeted ultrasound uses high frequency sound waves to create an image of the developing fetus.  An ultrasound is often recommended as a routine means of evaluating the pregnancy.  It is also used to screen for fetal anatomy problems (for example, a heart defect) that might be suggestive of a chromosomal or other abnormality.   Should these screening tests indicate an increased concern, then the following additional testing options would be offered:  The chorionic villus sampling procedure is available for first trimester chromosome analysis.  This involves the withdrawal of a small amount of chorionic villi (tissue from the developing placenta).  Risk of pregnancy loss is estimated to be approximately 1 in 200 to 1 in 100 (0.5 to 1%).  There is approximately  a 1% (1 in 100) chance that the CVS chromosome results will be unclear.  Chorionic villi cannot be tested for neural tube defects.     Amniocentesis involves the removal of a small amount of amniotic fluid from the sac surrounding the fetus with the use of a thin needle inserted through the maternal abdomen and uterus.  Ultrasound guidance is used throughout the procedure.  Fetal cells from amniotic fluid are directly evaluated and > 99.5% of chromosome problems and > 98% of open neural tube defects can be detected. This procedure is generally performed after the 15th week of pregnancy.  The main risks to this procedure include complications leading to miscarriage in less than 1 in 200 cases (0.5%).  As another option for information if the pregnancy is suspected to be an an increased chance for certain chromosome conditions, we also reviewed the availability of cell free fetal DNA testing from maternal blood to determine whether or not the baby may have either Down syndrome, trisomy 5513, or trisomy 5818.  This test utilizes a maternal blood sample and DNA sequencing technology to isolate circulating cell free fetal DNA from maternal plasma.  The fetal DNA can then be analyzed for DNA sequences that are derived from the three most common chromosomes involved in aneuploidy, chromosomes 13, 18, and 21.  If the overall amount of DNA is greater than the expected level for any of these chromosomes, aneuploidy is suspected.  While we do not consider it a replacement for invasive testing and karyotype analysis, a negative result from this testing would be reassuring, though not a guarantee of a normal chromosome complement for the baby.  An abnormal result  is certainly suggestive of an abnormal chromosome complement, though we would still recommend CVS or amniocentesis to confirm any findings from this testing.  Cystic Fibrosis and Spinal Muscular Atrophy (SMA) screening were also discussed with the patient. Both  conditions are recessive, which means that both parents must be carriers in order to have a child with the disease.  Cystic fibrosis (CF) is one of the most common genetic conditions in persons of Caucasian ancestry.  This condition occurs in approximately 1 in 2,500 Caucasian persons and results in thickened secretions in the lungs, digestive, and reproductive systems.  For a baby to be at risk for having CF, both of the parents must be carriers for this condition.  Approximately 1 in 12 Caucasian persons is a carrier for CF.  Current carrier testing looks for the most common mutations in the gene for CF and can detect approximately 90% of carriers in the Caucasian population.  This means that the carrier screening can greatly reduce, but cannot eliminate, the chance for an individual to have a child with CF.  If an individual is found to be a carrier for CF, then carrier testing would be available for the partner. As part of [name]'s newborn screening profile, all babies born in the state of [place] will have a two-tier screening process.  Specimens are first tested to determine the concentration of immunoreactive trypsinogen (IRT).  The top 5% of specimens with the highest IRT values then undergo DNA testing using a panel of over 40 common CF mutations. SMA is a neurodegenerative disorder that leads to atrophy of skeletal muscle and overall weakness.  This condition is also more prevalent in the Caucasian population, with 1 in 40-1 in 60 persons being a carrier and 1 in 6,000-1 in 10,000 children being affected.  There are multiple forms of the disease, with some causing death in infancy to other forms with survival into adulthood.  The genetics of SMA is complex, but carrier screening can detect up to 95% of carriers in the Caucasian population.  Similar to CF, a negative result can greatly reduce, but cannot eliminate, the chance to have a child with SMA.  We obtained a detailed family history  and pregnancy history.  Nancy Shaffer reported that her paternal half brother has sickle cell disease.  We reviewed the features of this condition as well as the inheritance and the fact that because their father is an obligate carrier, then her chance to be a carrier is 1 in 2.  She stated that she had prior testing and is not a carrier, but we do not have documentation of these results.  I spoke with the nurse at the [facility] and she will review the records from her prior pregnancy in [place] and confirm her results.  If testing results for hemoglobinopathy testing are not available, we would recommend they be repeated at her next OB visit.  The remainder of the family history was reported to be unremarkable for birth defects, intellectual delays, recurrent pregnancy loss or known chromosome abnormalities.  Nancy Shaffer stated that this is the second pregnancy for she and her partner.  She reported no complications or exposures in this pregnancy that would be expected to increase the risk for birth defects.  After consideration of the options, Nancy Shaffer elected to proceed with an ultrasound only.  She declined first trimester screening as well as carrier testing for CF and SMA.  An ultrasound was performed at the time of the visit.  The gestational age was consistent with  13 weeks.  Fetal anatomy could not be assessed due to early gestational age.  Please refer to the ultrasound report for details of that study.  Nancy Shaffer was encouraged to call with questions or concerns.  We can be contacted at [phone].  Labs ordered: none  Cherly Anderson, MS, CGC  I was immediately available and supervising. Argentina Ponder, MD [facility]

## 2018-03-01 ENCOUNTER — Other Ambulatory Visit: Payer: Self-pay

## 2018-03-01 ENCOUNTER — Emergency Department
Admission: EM | Admit: 2018-03-01 | Discharge: 2018-03-01 | Disposition: A | Discharge status: Home / Self Care | Payer: Medicaid Other | Attending: Emergency Medicine | Admitting: Emergency Medicine

## 2018-03-01 DIAGNOSIS — Z79899 Other long term (current) drug therapy: Secondary | ICD-10-CM | POA: Diagnosis not present

## 2018-03-01 DIAGNOSIS — N898 Other specified noninflammatory disorders of vagina: Secondary | ICD-10-CM | POA: Diagnosis not present

## 2018-03-01 DIAGNOSIS — R102 Pelvic and perineal pain: Secondary | ICD-10-CM | POA: Diagnosis not present

## 2018-03-01 DIAGNOSIS — Z3A30 30 weeks gestation of pregnancy: Secondary | ICD-10-CM | POA: Diagnosis not present

## 2018-03-01 DIAGNOSIS — O9989 Other specified diseases and conditions complicating pregnancy, childbirth and the puerperium: Secondary | ICD-10-CM | POA: Diagnosis not present

## 2018-03-01 DIAGNOSIS — R300 Dysuria: Secondary | ICD-10-CM | POA: Diagnosis not present

## 2018-03-01 LAB — CHLAMYDIA/NGC RT PCR (ARMC ONLY)
Chlamydia Tr: NOT DETECTED
N gonorrhoeae: NOT DETECTED

## 2018-03-01 LAB — WET PREP, GENITAL
Clue Cells Wet Prep HPF POC: NONE SEEN
Sperm: NONE SEEN
TRICH WET PREP: NONE SEEN
YEAST WET PREP: NONE SEEN

## 2018-03-01 LAB — URINALYSIS, ROUTINE W REFLEX MICROSCOPIC
Bilirubin Urine: NEGATIVE
GLUCOSE, UA: NEGATIVE mg/dL
HGB URINE DIPSTICK: NEGATIVE
Ketones, ur: NEGATIVE mg/dL
LEUKOCYTES UA: NEGATIVE
Nitrite: NEGATIVE
PROTEIN: NEGATIVE mg/dL
Specific Gravity, Urine: 1.02 (ref 1.005–1.030)
pH: 6 (ref 5.0–8.0)

## 2018-03-01 LAB — OB RESULTS CONSOLE GC/CHLAMYDIA
Chlamydia: NEGATIVE
GC PROBE AMP, GENITAL: NEGATIVE

## 2018-03-01 NOTE — ED Notes (Signed)
Signature pad on computer not working but patient understood d/c instructions and attempted to sign

## 2018-03-01 NOTE — ED Triage Notes (Addendum)
Pt reports vaginal pain and discharge x2 days - the discharge is white and without odor - denies STD exposure Pt is [redacted] weeks pregnant and reports burning with urination

## 2018-03-01 NOTE — ED Provider Notes (Signed)
[facility] Emergency Department Provider Note  Time seen: 6:09 PM  I have reviewed the triage vital signs and the nursing notes.   HISTORY  Chief Complaint Vaginal Pain and Vaginal Discharge    HPI Nancy Shaffer is a 22 y.o. female with no past medical history approximate [redacted] weeks pregnant who presents the emergency department for vaginal discharge.  Patient states she followed up with her OB/GYN 4 days ago had a normal checkup.  However she states over the past 2 days she has begun having fairly diffuse white vaginal discharge with slight dysuria.  Patient is sexually active.  Denies any abdominal pain.  Denies any bleeding.  Largely negative review of systems otherwise.   History reviewed. No pertinent past medical history.  Patient Active Problem List   Diagnosis Date Noted  . Family history of sickle cell disease   . First trimester screening     History reviewed. No pertinent surgical history.  Prior to Admission medications   Medication Sig Start Date End Date Taking? Authorizing Provider  metoCLOPramide (REGLAN) 10 MG tablet Take 1 tablet (10 mg total) by mouth every 8 (eight) hours as needed for nausea. 01/20/18   Judeth Horn, NP  Prenatal Vit-Fe Fumarate-FA (MULTIVITAMIN-PRENATAL) 27-0.8 MG TABS tablet Take 1 tablet by mouth daily at 12 noon.    [provider]    No Known Allergies  Family History  Problem Relation Age of Onset  . Sickle cell anemia Brother   . Diabetes Paternal Grandmother   . Arthritis Paternal Grandmother     Social History Social History   Tobacco Use  . Smoking status: Never Smoker  . Smokeless tobacco: Never Used  Substance Use Topics  . Alcohol use: Not Currently  . Drug use: Not Currently    Review of Systems Constitutional: Negative for fever. Cardiovascular: Negative for chest pain. Respiratory: Negative for shortness of breath. Gastrointestinal: Negative for abdominal pain, vomiting  and diarrhea. Genitourinary: States mild dysuria for the past 2 days, white vaginal discharge x2 days. Musculoskeletal: Negative for musculoskeletal complaints Neurological: Negative for headache All other ROS negative  ____________________________________________   PHYSICAL EXAM:  VITAL SIGNS: ED Triage Vitals  Enc Vitals Group     BP 03/01/18 1722 108/67     Pulse Rate 03/01/18 1722 76     Resp 03/01/18 1722 18     Temp 03/01/18 1722 99 F (37.2 C)     Temp Source 03/01/18 1722 Oral     SpO2 03/01/18 1722 100 %     Weight 03/01/18 1722 171 lb (77.6 kg)     Height 03/01/18 1722 5\' 4"  (1.626 m)     Head Circumference --      Peak Flow --      Pain Score 03/01/18 1732 6     Pain Loc --      Pain Edu? --      Excl. in GC? --    Constitutional: Alert and oriented. Well appearing and in no distress. Eyes: Normal exam ENT   Head: Normocephalic and atraumatic.   Mouth/Throat: Mucous membranes are moist. Cardiovascular: Normal rate, regular rhythm. No murmur Respiratory: Normal respiratory effort without tachypnea nor retractions. Breath sounds are clear Gastrointestinal: Soft and nontender. No distention.  Musculoskeletal: Nontender with normal range of motion in all extremities.  Neurologic:  Normal speech and language. No gross focal neurologic deficits Skin:  Skin is warm, dry and intact.  Psychiatric: Mood and affect are normal.  INITIAL IMPRESSION / ASSESSMENT AND PLAN / ED COURSE  Pertinent labs & imaging results that were available during my care of the patient were reviewed by me and considered in my medical decision making (see chart for details).  Patient presents to the emergency department for vaginal discharge and dysuria.  According to the patient for the past 2 days she has been experiencing pain when she urinates as well as white vaginal discharge.  No abdominal pain, nontender abdomen.  Has an OB/GYN that she follows up with.  We will send a  urinalysis and perform a pelvic examination.  Patient agreeable to plan of care.  Healthy exam shows mild amount of white thick appearing discharge.  Wet prep is negative.  Urinalysis is normal.  I discussed with the patient she will be called if her GC/chlamydia is positive.  Patient agreeable to this plan of care. ____________________________________________   FINAL CLINICAL IMPRESSION(S) / ED DIAGNOSES  Vaginal discharge Dysuria    Minna AntisPaduchowski, Vernice Bowker, MD 03/01/18 1857

## 2018-03-28 ENCOUNTER — Other Ambulatory Visit: Payer: Self-pay | Admitting: *Deleted

## 2018-03-28 DIAGNOSIS — Z3689 Encounter for other specified antenatal screening: Secondary | ICD-10-CM

## 2018-03-31 ENCOUNTER — Ambulatory Visit
Admission: RE | Admit: 2018-03-31 | Discharge: 2018-03-31 | Disposition: A | Discharge status: Home / Self Care | Payer: Medicaid Other | Source: Ambulatory Visit | Attending: Maternal & Fetal Medicine | Admitting: Maternal & Fetal Medicine

## 2018-03-31 DIAGNOSIS — O321XX0 Maternal care for breech presentation, not applicable or unspecified: Secondary | ICD-10-CM | POA: Diagnosis not present

## 2018-03-31 DIAGNOSIS — Z3689 Encounter for other specified antenatal screening: Secondary | ICD-10-CM | POA: Diagnosis not present

## 2018-03-31 DIAGNOSIS — Z3A18 18 weeks gestation of pregnancy: Secondary | ICD-10-CM | POA: Diagnosis not present

## 2018-06-16 LAB — HM HIV SCREENING LAB: HM HIV Screening: NEGATIVE

## 2018-08-06 LAB — OB RESULTS CONSOLE GBS: GBS: NEGATIVE

## 2018-08-17 NOTE — L&D Delivery Note (Signed)
Date of delivery: 09/06/2018 Estimated Date of Delivery: 09/01/18 Patient's last menstrual period was 11/25/2017. EGA: [redacted]w[redacted]d  Delivery Note At 5:21 AM a viable female was delivered via Vaginal, Spontaneous (Presentation: cephalic; ROA).  APGAR: , ; weight  pending.   Placenta status: sponatneous, intact.  Cord: 3vv with the following complications: none apparent.  Cord pH: not collected  Anesthesia:  None Episiotomy: None Lacerations:  None - right labial abrasion not repaired  Suture Repair: n/a Est. Blood Loss (mL):  180cc   Mom presented to L&D with PROM, augmented with pitocin. Forebag AROM'd for clear fluid.  Progressed to complete, second stage: <2 minutes, with delivery of fetal head with restitution to ROT.   Anterior then posterior shoulders delivered without difficulty.  Baby placed on mom's chest, and attended to by peds. Cord was then clamped and cut by FOB when pulseless.  Placenta spontaneously delivered, intact.   IV pitocin given for hemorrhage prophylaxis.   We sang happy birthday to baby Magesty.   Mom to postpartum.  Baby to Couplet care / Skin to Skin.  Floride Hutmacher C Lashuna Tamashiro 09/06/2018, 5:32 AM

## 2018-08-26 LAB — OB RESULTS CONSOLE RUBELLA ANTIBODY, IGM: Rubella: NON-IMMUNE/NOT IMMUNE

## 2018-08-26 LAB — OB RESULTS CONSOLE VARICELLA ZOSTER ANTIBODY, IGG: Varicella: NON-IMMUNE/NOT IMMUNE

## 2018-08-30 ENCOUNTER — Other Ambulatory Visit: Payer: Self-pay | Admitting: Obstetrics and Gynecology

## 2018-08-30 NOTE — Progress Notes (Signed)
IOL orders for admission 1/25

## 2018-09-03 ENCOUNTER — Other Ambulatory Visit: Payer: Self-pay | Admitting: Obstetrics and Gynecology

## 2018-09-05 ENCOUNTER — Other Ambulatory Visit: Payer: Self-pay

## 2018-09-05 ENCOUNTER — Inpatient Hospital Stay
Admission: EM | Admit: 2018-09-05 | Discharge: 2018-09-07 | DRG: 807 | Disposition: A | Discharge status: Home / Self Care | Payer: Medicaid Other | Attending: Obstetrics & Gynecology | Admitting: Obstetrics & Gynecology

## 2018-09-05 DIAGNOSIS — D649 Anemia, unspecified: Secondary | ICD-10-CM | POA: Diagnosis present

## 2018-09-05 DIAGNOSIS — E669 Obesity, unspecified: Secondary | ICD-10-CM | POA: Diagnosis present

## 2018-09-05 DIAGNOSIS — O4292 Full-term premature rupture of membranes, unspecified as to length of time between rupture and onset of labor: Secondary | ICD-10-CM | POA: Diagnosis present

## 2018-09-05 DIAGNOSIS — Z3A40 40 weeks gestation of pregnancy: Secondary | ICD-10-CM

## 2018-09-05 DIAGNOSIS — O9902 Anemia complicating childbirth: Secondary | ICD-10-CM | POA: Diagnosis present

## 2018-09-05 DIAGNOSIS — O480 Post-term pregnancy: Secondary | ICD-10-CM

## 2018-09-05 DIAGNOSIS — O99214 Obesity complicating childbirth: Secondary | ICD-10-CM | POA: Diagnosis present

## 2018-09-05 LAB — CBC
HCT: 37.5 % (ref 36.0–46.0)
Hemoglobin: 11.8 g/dL — ABNORMAL LOW (ref 12.0–15.0)
MCH: 26.5 pg (ref 26.0–34.0)
MCHC: 31.5 g/dL (ref 30.0–36.0)
MCV: 84.3 fL (ref 80.0–100.0)
Platelets: 365 10*3/uL (ref 150–400)
RBC: 4.45 MIL/uL (ref 3.87–5.11)
RDW: 14.6 % (ref 11.5–15.5)
WBC: 13.9 10*3/uL — ABNORMAL HIGH (ref 4.0–10.5)
nRBC: 0 % (ref 0.0–0.2)

## 2018-09-05 LAB — TYPE AND SCREEN
ABO/RH(D): A POS
Antibody Screen: NEGATIVE

## 2018-09-05 LAB — RUPTURE OF MEMBRANE (ROM)PLUS: Rom Plus: POSITIVE

## 2018-09-05 LAB — OB RESULTS CONSOLE GC/CHLAMYDIA
Chlamydia: NEGATIVE
Gonorrhea: NEGATIVE

## 2018-09-05 MED ORDER — OXYTOCIN 40 UNITS IN NORMAL SALINE INFUSION - SIMPLE MED
1.0000 m[IU]/min | INTRAVENOUS | Status: DC
  Administered 2018-09-05: 2 m[IU]/min via INTRAVENOUS

## 2018-09-05 MED ORDER — LACTATED RINGERS IV SOLN
INTRAVENOUS | Status: DC
  Administered 2018-09-05 – 2018-09-06 (×2): via INTRAVENOUS

## 2018-09-05 MED ORDER — OXYTOCIN 40 UNITS IN NORMAL SALINE INFUSION - SIMPLE MED
2.5000 [IU]/h | INTRAVENOUS | Status: DC
  Filled 2018-09-05: qty 1000

## 2018-09-05 MED ORDER — MISOPROSTOL 200 MCG PO TABS
ORAL_TABLET | ORAL | Status: AC
  Filled 2018-09-05: qty 4

## 2018-09-05 MED ORDER — MISOPROSTOL 25 MCG QUARTER TABLET: 25.0000 ug | ORAL_TABLET | ORAL | Status: DC | PRN

## 2018-09-05 MED ORDER — TERBUTALINE SULFATE 1 MG/ML IJ SOLN: 0.2500 mg | Freq: Once | INTRAMUSCULAR | Status: DC | PRN

## 2018-09-05 MED ORDER — AMMONIA AROMATIC IN INHA
RESPIRATORY_TRACT | Status: AC
  Filled 2018-09-05: qty 10

## 2018-09-05 MED ORDER — ONDANSETRON HCL 4 MG/2ML IJ SOLN
4.0000 mg | Freq: Four times a day (QID) | INTRAMUSCULAR | Status: DC | PRN
  Administered 2018-09-06: 4 mg via INTRAVENOUS
  Filled 2018-09-05: qty 2

## 2018-09-05 MED ORDER — OXYTOCIN 10 UNIT/ML IJ SOLN
INTRAMUSCULAR | Status: AC
  Filled 2018-09-05: qty 2

## 2018-09-05 MED ORDER — SOD CITRATE-CITRIC ACID 500-334 MG/5ML PO SOLN: 30.0000 mL | ORAL | Status: DC | PRN

## 2018-09-05 MED ORDER — LIDOCAINE HCL (PF) 1 % IJ SOLN
INTRAMUSCULAR | Status: AC
  Filled 2018-09-05: qty 30

## 2018-09-05 MED ORDER — OXYTOCIN BOLUS FROM INFUSION
500.0000 mL | Freq: Once | INTRAVENOUS | Status: AC
  Administered 2018-09-06: 500 mL via INTRAVENOUS

## 2018-09-05 MED ORDER — FENTANYL CITRATE (PF) 100 MCG/2ML IJ SOLN
50.0000 ug | INTRAMUSCULAR | Status: DC | PRN
  Administered 2018-09-06: 100 ug via INTRAVENOUS
  Filled 2018-09-05: qty 2

## 2018-09-05 MED ORDER — LACTATED RINGERS IV SOLN: 500.0000 mL | INTRAVENOUS | Status: DC | PRN

## 2018-09-05 MED ORDER — LIDOCAINE HCL (PF) 1 % IJ SOLN: 30.0000 mL | INTRAMUSCULAR | Status: DC | PRN

## 2018-09-05 MED ORDER — ACETAMINOPHEN 325 MG PO TABS: 650.0000 mg | ORAL_TABLET | ORAL | Status: DC | PRN

## 2018-09-05 NOTE — OB Triage Note (Signed)
Pt G2P1 complains of LOF for a week. States it was thick clear, white discharge, but states it is now thick, mucous white d/c, malodorous. States she has had occasional ctx. States + FM. VSS. Monitors applied and assessing.

## 2018-09-06 LAB — RPR: RPR Ser Ql: NONREACTIVE

## 2018-09-06 MED ORDER — BENZOCAINE-MENTHOL 20-0.5 % EX AERO
1.0000 "application " | INHALATION_SPRAY | CUTANEOUS | Status: DC | PRN
  Administered 2018-09-06: 1 via TOPICAL
  Filled 2018-09-06: qty 56

## 2018-09-06 MED ORDER — IBUPROFEN 600 MG PO TABS
600.0000 mg | ORAL_TABLET | Freq: Four times a day (QID) | ORAL | Status: DC
  Administered 2018-09-07 (×3): 600 mg via ORAL
  Filled 2018-09-06 (×3): qty 1

## 2018-09-06 MED ORDER — ACETAMINOPHEN 500 MG PO TABS
1000.0000 mg | ORAL_TABLET | Freq: Four times a day (QID) | ORAL | Status: DC | PRN
  Administered 2018-09-06 – 2018-09-07 (×5): 1000 mg via ORAL
  Filled 2018-09-06 (×5): qty 2

## 2018-09-06 MED ORDER — PRENATAL MULTIVITAMIN CH
1.0000 | ORAL_TABLET | Freq: Every day | ORAL | Status: DC
  Administered 2018-09-06: 1 via ORAL
  Filled 2018-09-06: qty 1

## 2018-09-06 MED ORDER — INFLUENZA VAC SPLIT QUAD 0.5 ML IM SUSY: 0.5000 mL | PREFILLED_SYRINGE | INTRAMUSCULAR | Status: DC | PRN

## 2018-09-06 MED ORDER — ONDANSETRON HCL 4 MG/2ML IJ SOLN: 4.0000 mg | INTRAMUSCULAR | Status: DC | PRN

## 2018-09-06 MED ORDER — COCONUT OIL OIL: 1.0000 "application " | TOPICAL_OIL | Status: DC | PRN

## 2018-09-06 MED ORDER — ONDANSETRON HCL 4 MG PO TABS: 4.0000 mg | ORAL_TABLET | ORAL | Status: DC | PRN

## 2018-09-06 MED ORDER — DOCUSATE SODIUM 100 MG PO CAPS
100.0000 mg | ORAL_CAPSULE | Freq: Two times a day (BID) | ORAL | Status: DC
  Administered 2018-09-06 – 2018-09-07 (×2): 100 mg via ORAL
  Filled 2018-09-06 (×2): qty 1

## 2018-09-06 MED ORDER — SIMETHICONE 80 MG PO CHEW: 80.0000 mg | CHEWABLE_TABLET | ORAL | Status: DC | PRN

## 2018-09-06 MED ORDER — DIPHENHYDRAMINE HCL 25 MG PO CAPS: 25.0000 mg | ORAL_CAPSULE | Freq: Four times a day (QID) | ORAL | Status: DC | PRN

## 2018-09-06 MED ORDER — WITCH HAZEL-GLYCERIN EX PADS: 1.0000 "application " | MEDICATED_PAD | CUTANEOUS | Status: DC

## 2018-09-06 MED ORDER — IBUPROFEN 600 MG PO TABS
600.0000 mg | ORAL_TABLET | Freq: Four times a day (QID) | ORAL | Status: DC
  Administered 2018-09-06 (×3): 600 mg via ORAL
  Filled 2018-09-06 (×4): qty 1

## 2018-09-06 MED ORDER — DIBUCAINE 1 % RE OINT: 1.0000 "application " | TOPICAL_OINTMENT | RECTAL | Status: DC | PRN

## 2018-09-06 MED ORDER — MEASLES, MUMPS & RUBELLA VAC IJ SOLR
0.5000 mL | Freq: Once | INTRAMUSCULAR | Status: AC
  Administered 2018-09-07: 0.5 mL via SUBCUTANEOUS
  Filled 2018-09-06 (×2): qty 0.5

## 2018-09-06 NOTE — H&P (Signed)
OB History & Physical   History of Present Illness:  Chief Complaint:   HPI:  Nancy Shaffer is a 23 y.o. G56P1001 female at [redacted]w[redacted]d dated by Patient's last menstrual period was 11/25/2017. Estimated Date of Delivery: 09/01/18  She presents to L&D for unsure rupture of membranes.  Patient states she has been leaking fluid for 1-2 weeks now.  Sticky white clearish fluid.  +FM, no CTX, ? LOF, no VB  Pregnancy Issues: 1. Rubella non-immune 2. Obesity prepregnancy BMI 30.6 3. Varicella non-immune 4. Anemia   Maternal Medical History:  History reviewed. No pertinent past medical history.  History reviewed. No pertinent surgical history.  No Known Allergies  Prior to Admission medications   Medication Sig Start Date End Date Taking? Authorizing Provider  Prenatal Vit-Fe Fumarate-FA (MULTIVITAMIN-PRENATAL) 27-0.8 MG TABS tablet Take 1 tablet by mouth daily at 12 noon.   Yes [provider]  metoCLOPramide (REGLAN) 10 MG tablet Take 1 tablet (10 mg total) by mouth every 8 (eight) hours as needed for nausea. Patient not taking: Reported on 09/05/2018 01/20/18   Judeth Horn, NP     Prenatal care site: [facility]   Social History: She  reports that she has never smoked. She has never used smokeless tobacco. She reports previous alcohol use. She reports previous drug use.  Family History: family history includes Arthritis in her paternal grandmother; Diabetes in her paternal grandmother; Sickle cell anemia in her brother.   Review of Systems: A full review of systems was performed and negative except as noted in the HPI.     Physical Exam:  Vital Signs: BP 111/90   Pulse 90   Temp 97.6 F (36.4 C) (Oral)   Resp 14   Ht 5\' 4"  (1.626 m)   Wt 85.3 kg   LMP 11/25/2017   BMI 32.27 kg/m  General: no acute distress.  HEENT: normocephalic, atraumatic Heart: regular rate & rhythm.  No murmurs/rubs/gallops Lungs: clear to auscultation bilaterally,  normal respiratory effort Abdomen: soft, gravid, non-tender;  EFW: 7.8 Pelvic:   External: Normal external female genitalia  Cervix: Dilation: 1.5 / Effacement (%): 70 / Station: -2    Extremities: non-tender, symmetric, 1+ edema bilaterally.  DTRs: 2+ Neurologic: Alert & oriented x 3.    Results for orders placed or performed during the hospital encounter of 09/05/18 (from the past 24 hour(s))  ROM Plus ([facility] only)     Status: None   Collection Time: 09/05/18  3:42 PM  Result Value Ref Range   Rom Plus POSITIVE   CBC     Status: Abnormal   Collection Time: 09/05/18  4:26 PM  Result Value Ref Range   WBC 13.9 (H) 4.0 - 10.5 K/uL   RBC 4.45 3.87 - 5.11 MIL/uL   Hemoglobin 11.8 (L) 12.0 - 15.0 g/dL   HCT 62.0 35.5 - 97.4 %   MCV 84.3 80.0 - 100.0 fL   MCH 26.5 26.0 - 34.0 pg   MCHC 31.5 30.0 - 36.0 g/dL   RDW 16.3 84.5 - 36.4 %   Platelets 365 150 - 400 K/uL   nRBC 0.0 0.0 - 0.2 %  Type and screen     Status: None (Preliminary result)   Collection Time: 09/05/18  4:26 PM  Result Value Ref Range   ABO/RH(D) PENDING    Antibody Screen PENDING    Sample Expiration      09/08/2018 Performed at [facility], [address] [place], [place] [address]  Type and screen     Status: None   Collection Time: 09/05/18  6:38 PM  Result Value Ref Range   ABO/RH(D) A POS    Antibody Screen NEG    Sample Expiration      09/08/2018 Performed at [facility], [address] [place], [place] [address]     Pertinent Results:  Prenatal Labs: Blood type/Rh A+  Antibody screen neg  Rubella Non-Immune  Varicella Non-Immune  RPR NR  HBsAg Neg  HIV NR  GC neg  Chlamydia neg  Genetic screening negative  1 hour GTT 89  3 hour GTT   GBS Neg 08/06/18   FHT: 130 mod + accels no decels  TOCO: occasional SVE:  Dilation: 1.5 / Effacement (%): 70 / Station: -2    Cephalic by leopolds ROM plus positive   Assessment:  Nancy Shaffer is a 23 y.o. G84P1001  female at [redacted]w[redacted]d with PROM.   Plan:  1. Admit to Labor & Delivery 2. CBC, T&S, Clrs, IVF 3. GBS negative  4. Consents obtained. 5. Continuous efm/toco 6. IUP: category 1 7. Allow patient to walk and see if labor occurs. If not will augment with pitocin  ----- Ranae Plumber, MD Attending Obstetrician and Gynecologist [facility], Department of OB/GYN [facility]

## 2018-09-06 NOTE — Lactation Note (Addendum)
This note was copied from a baby's chart. Lactation Consultation Note  Patient Name: Nancy Shaffer JQZES'P Date: 09/06/2018 Reason for consult: Initial assessment FOB wanted to give baby formula, reassured mom and dad that baby was getting colostrum, listen for swallows, baby is eager to nurse and this makes more milk   Maternal Data Formula Feeding for Exclusion: No Has patient been taught Hand Expression?: Yes Does the patient have breastfeeding experience prior to this delivery?: Yes  Feeding Feeding Type: Breast Fed  LATCH Score Latch: Grasps breast easily, tongue down, lips flanged, rhythmical sucking.  Audible Swallowing: A few with stimulation  Type of Nipple: Everted at rest and after stimulation  Comfort (Breast/Nipple): Soft / non-tender  Hold (Positioning): Assistance needed to correctly position infant at breast and maintain latch.  LATCH Score: 8  Interventions Interventions: Breast feeding basics reviewed;Assisted with latch;Hand express;Adjust position;Support pillows  Lactation Tools Discussed/Used WIC Program: Yes   Consult Status Consult Status: PRN    Dyann Kief 09/06/2018, 11:50 AM

## 2018-09-06 NOTE — Discharge Summary (Signed)
Obstetrical Discharge Summary  Patient Name: Nancy Shaffer DOB: 08-10-1996 MRN: [id number]  Date of Admission: 09/05/2018 Date of Delivery: 09/06/2018 Delivered by:  Ranae Plumber, MD Date of Discharge: 09/07/2018 Primary OB:  [facility]  UOH:[id number]'D last menstrual period was 11/25/2017. EDC Estimated Date of Delivery: 09/01/18 Gestational Age at Delivery: [redacted]w[redacted]d   Antepartum complications:  1. Rubella non-immune 2. Obesity prepregnancy BMI 30.6 3. Varicella non-immune 4. Anemia   Admitting Diagnosis: premature rupture of membranes  Secondary Diagnosis: Patient Active Problem List   Diagnosis Date Noted  . Post-term pregnancy, 40-42 weeks of gestation 09/05/2018  . Family history of sickle cell disease   . First trimester screening     Augmentation: Pitocin Complications: None Intrapartum complications/course: Mom presented to L&D with PROM, augmented with pitocin. Forebag AROM'd for clear fluid.  Progressed to complete, second stage: <2 minutes, with delivery of fetal head with restitution to ROT.  Anterior then posterior shoulders delivered without difficulty.  Baby placed on mom's chest, and attended to by peds. Cord was then clamped and cut by FOB when pulseless.  Placenta spontaneously delivered, intact.   IV pitocin given for hemorrhage prophylaxis. Date of Delivery: 09/06/2018 Delivered By: Leeroy Bock Ward Delivery Type: spontaneous vaginal delivery Anesthesia: none Placenta: spontaneous Laceration: none, right labial abrasion Episiotomy: none Newborn Data: Live born female "Nancy Shaffer" Birth Weight:    APGAR: 8, 9   Newborn Delivery   Birth date/time:  09/06/2018 05:21:00 Delivery type:  Vaginal, Spontaneous     Postpartum Procedures: none  Post partum course:  Patient had an uncomplicated postpartum course.  By time of discharge on PPD#1, her pain was controlled on oral pain medications; she had appropriate lochia and was ambulating, voiding without difficulty and  tolerating regular diet.  She was deemed stable for discharge to home.     Discharge Physical Exam: 09/07/2018  BP 107/60 (BP Location: Right Arm)   Pulse 82   Temp 98 F (36.7 C) (Oral)   Resp 18   Ht 5\' 4"  (1.626 m)   Wt 85.3 kg   LMP 11/25/2017   SpO2 100%   Breastfeeding Unknown   BMI 32.27 kg/m   General: NAD CV: RRR Pulm: CTABL, nl effort ABD: s/nd/nt, fundus firm and below the umbilicus Lochia: moderate DVT Evaluation: LE non-ttp, no evidence of DVT on exam.  Hemoglobin  Date Value Ref Range Status  09/07/2018 10.2 (L) 12.0 - 15.0 g/dL Final   HCT  Date Value Ref Range Status  09/07/2018 32.6 (L) 36.0 - 46.0 % Final    Results for orders placed or performed during the hospital encounter of 09/05/18 (from the past 24 hour(s))  CBC     Status: Abnormal   Collection Time: 09/07/18  5:15 AM  Result Value Ref Range   WBC 17.7 (H) 4.0 - 10.5 K/uL   RBC 3.84 (L) 3.87 - 5.11 MIL/uL   Hemoglobin 10.2 (L) 12.0 - 15.0 g/dL   HCT 55.2 (L) 08.0 - 22.3 %   MCV 84.9 80.0 - 100.0 fL   MCH 26.6 26.0 - 34.0 pg   MCHC 31.3 30.0 - 36.0 g/dL   RDW 36.1 22.4 - 49.7 %   Platelets 319 150 - 400 K/uL   nRBC 0.0 0.0 - 0.2 %   Disposition: stable, discharge to home. Baby Feeding: breastmilk and formula Baby Disposition: home with mom  Rh Immune globulin given: no Rubella vaccine given: given , Varivax given before d/c  Tdap vaccine given in AP or PP setting: AP  Flu vaccine given in AP or PP setting: declined AP, offered PP  Contraception: pills  Prenatal Labs:   Blood type/Rh A+  Antibody screen neg  Rubella Non-Immune  Varicella Non-Immune  RPR NR  HBsAg Neg  HIV NR  GC neg  Chlamydia neg  Genetic screening negative  1 hour GTT 89  3 hour GTT   GBS Neg 08/06/18     Plan:  Nancy Shaffer was discharged to home in good condition. Follow-up appointment with delivering provider in 6 weeks.  Discharge Medications: Allergies as of 09/07/2018   No Known  Allergies     Medication List    TAKE these medications   ibuprofen 600 MG tablet Commonly known as:  ADVIL,MOTRIN Take 1 tablet (600 mg total) by mouth every 6 (six) hours.   multivitamin-prenatal 27-0.8 MG Tabs tablet Take 1 tablet by mouth daily at 12 noon.   norethindrone 0.35 MG tablet Commonly known as:  ORTHO MICRONOR Take 1 tablet (0.35 mg total) by mouth daily.       Follow-up Information    Ward, Elenora Fender, MD. Call in 6 week(s).   Specialty:  Obstetrics and Gynecology Why:  [facility] for 6 week appt  Contact information: [address] [place] [address] [phone]           Signed: Jennell Corner, MD Hit refresh and delete this line

## 2018-09-07 LAB — CBC
HCT: 32.6 % — ABNORMAL LOW (ref 36.0–46.0)
Hemoglobin: 10.2 g/dL — ABNORMAL LOW (ref 12.0–15.0)
MCH: 26.6 pg (ref 26.0–34.0)
MCHC: 31.3 g/dL (ref 30.0–36.0)
MCV: 84.9 fL (ref 80.0–100.0)
Platelets: 319 10*3/uL (ref 150–400)
RBC: 3.84 MIL/uL — ABNORMAL LOW (ref 3.87–5.11)
RDW: 14.7 % (ref 11.5–15.5)
WBC: 17.7 10*3/uL — ABNORMAL HIGH (ref 4.0–10.5)
nRBC: 0 % (ref 0.0–0.2)

## 2018-09-07 MED ORDER — VARICELLA VIRUS VACCINE LIVE 1350 PFU/0.5ML IJ SUSR
0.5000 mL | Freq: Once | INTRAMUSCULAR | Status: AC
  Administered 2018-09-07: 0.5 mL via SUBCUTANEOUS
  Filled 2018-09-07 (×2): qty 0.5

## 2018-09-07 MED ORDER — IBUPROFEN 600 MG PO TABS: 600.0000 mg | ORAL_TABLET | Freq: Four times a day (QID) | ORAL | 0 refills | Status: DC

## 2018-09-07 MED ORDER — NORETHINDRONE 0.35 MG PO TABS: 1.0000 | ORAL_TABLET | Freq: Every day | ORAL | 11 refills | Status: DC

## 2018-09-07 NOTE — Lactation Note (Signed)
This note was copied from a baby's chart. Lactation Consultation Note  Patient Name: Girl Marylisa Kalivas [id number] Date: 09/07/2018     Maternal Data    Feeding Feeding Type: Breast Fed  LATCH Score                   Interventions Interventions: Skin to skin(with dad)  Lactation Tools Discussed/Used Tools: Nipple Dorris Carnes   Consult Status   LC went to assist mother of baby with breastfeeding, but baby was uncomfortable due to possible over feeding after prior nursing session. RN of couplet notice FOB was bottlefeeding baby in bassinet. LC asked parents if they tried burping baby post-bottle feeding because baby was very fussy. LC offered to assist with breastfeeding, educated parents on how breastmilk supply is established, resources, and when/how to use pacifiers. FOB admit about bottle feeding while MOB states she breastfeed her first baby "for a few months" and would like to try to do so with this baby. LC told parents staff is available if they'd like to continue to work on breastfeeding and will be supportive of their feeding choices. Parents declined assistance with nursing and LC instructed them on how to pace bottle feed and properly make powdered infant formula.    Burnadette Peter 09/07/2018, 6:07 PM

## 2018-09-07 NOTE — Progress Notes (Signed)
Pt discharged with infant.  Discharge instructions, prescriptions and follow up appointment given to and reviewed with pt. Pt verbalized understanding. Escorted out by auxillary. 

## 2018-10-26 DIAGNOSIS — E669 Obesity, unspecified: Secondary | ICD-10-CM | POA: Insufficient documentation

## 2019-05-23 DIAGNOSIS — E669 Obesity, unspecified: Secondary | ICD-10-CM

## 2019-05-23 DIAGNOSIS — Z6832 Body mass index (BMI) 32.0-32.9, adult: Secondary | ICD-10-CM

## 2019-05-24 ENCOUNTER — Other Ambulatory Visit: Payer: Self-pay

## 2019-05-24 ENCOUNTER — Encounter: Payer: Self-pay | Admitting: Family Medicine

## 2019-05-24 ENCOUNTER — Ambulatory Visit (LOCAL_COMMUNITY_HEALTH_CENTER): Payer: Medicaid Other | Admitting: Family Medicine

## 2019-05-24 VITALS — BP 125/78 | Ht 65.0 in | Wt 189.0 lb

## 2019-05-24 DIAGNOSIS — Z30011 Encounter for initial prescription of contraceptive pills: Secondary | ICD-10-CM | POA: Diagnosis not present

## 2019-05-24 DIAGNOSIS — Z3009 Encounter for other general counseling and advice on contraception: Secondary | ICD-10-CM

## 2019-05-24 LAB — PREGNANCY, URINE: Preg Test, Ur: NEGATIVE

## 2019-05-24 MED ORDER — NORGESTIMATE-ETH ESTRADIOL 0.25-35 MG-MCG PO TABS: 1.0000 | ORAL_TABLET | Freq: Every day | ORAL | 6 refills | Status: DC

## 2019-05-24 NOTE — Progress Notes (Signed)
   [place] problem visit  [place] Department  Subjective:  Nancy Shaffer is a 23 y.o. being seen today for   Chief Complaint  Patient presents with  . Menstrual Problem    concerned about irregular period    HPI Client states that she would like to change pills.  She was given Micronor @ her PP visit because she ws going to  Breastfeed.  She decided not to breastfeed.  She feels that her menses aren't normal on these pills.  She admits that she has missed pills in the past and sometimes uses condoms as backup.  Last time she had sex was about 3 weeks ago.    Does the patient have a current or past history of drug use? No   No components found for: HCV]   Health Maintenance Due  Topic Date Due  . HIV Screening  10/21/2009  . TETANUS/TDAP  10/21/2013  . PAP-Cervical Cytology Screening  10/22/2015  . PAP SMEAR-Modifier  10/22/2015  . INFLUENZA VACCINE  03/18/2019    ROS  The following portions of the patient's history were reviewed and updated as appropriate: allergies, current medications, past family history, past medical history, past social history, past surgical history and problem list. Problem list updated.   See flowsheet for other program required questions.  Objective:   Vitals:   05/24/19 0823  BP: 125/78  Weight: 189 lb (85.7 kg)  Height: 5\' 5"  (1.651 m)    Physical Exam  Not indicated   Assessment and Plan:  Nancy Shaffer is a 23 y.o. female presenting to the [facility] Department for a Women's Health problem visit  1. General counseling and advice on contraceptive management - Pregnancy, urine- negative  2. OCP (oral contraceptive pills) initiation  - norgestimate-ethinyl estradiol (ORTHO-CYCLEN) 0.25-35 MG-MCG tablet; Take 1 tablet by mouth daily.  Dispense: 1 Package; Refill: 6 Co. To use condoms for back up if misses pills and always for STD prevention.     No follow-ups on  file.  No future appointments.  Hassell Done, FNP

## 2019-05-24 NOTE — Progress Notes (Signed)
Pt here as she is concerned about an irregular period she had that started 05/16/2019. Reports menstruall blood was really light in color, lighter flow than normal, and had a "blob" of clear discharge that was like jell. Last normal period was 04/18/2019. Pt reports she is using birth control pills but forgets it sometimes. Would like to discuss other birth control options with provider.Ronny Bacon, RN

## 2019-05-24 NOTE — Progress Notes (Signed)
Urine PT negative today. Pt left prior to RN posting so unable to get consent for [facility] signed.Ronny Bacon, RN

## 2019-05-25 ENCOUNTER — Emergency Department
Admission: EM | Admit: 2019-05-25 | Discharge: 2019-05-25 | Disposition: A | Discharge status: Home / Self Care | Payer: Medicaid Other | Attending: Emergency Medicine | Admitting: Emergency Medicine

## 2019-05-25 ENCOUNTER — Encounter: Payer: Self-pay | Admitting: Intensive Care

## 2019-05-25 ENCOUNTER — Other Ambulatory Visit: Payer: Self-pay

## 2019-05-25 DIAGNOSIS — N390 Urinary tract infection, site not specified: Secondary | ICD-10-CM | POA: Diagnosis not present

## 2019-05-25 DIAGNOSIS — R140 Abdominal distension (gaseous): Secondary | ICD-10-CM | POA: Diagnosis present

## 2019-05-25 DIAGNOSIS — Z79899 Other long term (current) drug therapy: Secondary | ICD-10-CM | POA: Insufficient documentation

## 2019-05-25 DIAGNOSIS — Z3202 Encounter for pregnancy test, result negative: Secondary | ICD-10-CM | POA: Insufficient documentation

## 2019-05-25 LAB — URINALYSIS, COMPLETE (UACMP) WITH MICROSCOPIC
Bilirubin Urine: NEGATIVE
Glucose, UA: NEGATIVE mg/dL
Hgb urine dipstick: NEGATIVE
Ketones, ur: NEGATIVE mg/dL
Nitrite: NEGATIVE
Protein, ur: NEGATIVE mg/dL
Specific Gravity, Urine: 1.006 (ref 1.005–1.030)
pH: 6 (ref 5.0–8.0)

## 2019-05-25 LAB — HCG, QUANTITATIVE, PREGNANCY: hCG, Beta Chain, Quant, S: <1 m[IU]/mL (ref <5)

## 2019-05-25 LAB — POCT PREGNANCY, URINE: Preg Test, Ur: NEGATIVE

## 2019-05-25 MED ORDER — NITROFURANTOIN MONOHYD MACRO 100 MG PO CAPS: 100.0000 mg | ORAL_CAPSULE | Freq: Two times a day (BID) | ORAL | 0 refills | Status: DC

## 2019-05-25 NOTE — ED Notes (Signed)
See triage note  States she has a negative urine preg test done yesterday  Was seen in office yesterday

## 2019-05-25 NOTE — ED Triage Notes (Signed)
Patient reports being seen on 05/23/19 and had urine preg test completed that showed negative. Patient is here today for pregnancy blood test

## 2019-05-25 NOTE — ED Provider Notes (Signed)
[facility] Emergency Department Provider Note  ____________________________________________   First MD Initiated Contact with Patient 05/25/19 1609     (approximate)  I have reviewed the triage vital signs and the nursing notes.   HISTORY  Chief Complaint Possible Pregnancy    HPI Nancy Shaffer is a 23 y.o. female presents emergency department requesting a blood pregnancy test.  Patient states that she had a negative urine pregnancy test at her doctor's office.  She states she still is feeling bloated and pressure at the bladder and thinks that she is pregnant is just not showing in the urine.  States her last period was light and she had spotting.  No vomiting or diarrhea.  Noted breast tenderness.    History reviewed. No pertinent past medical history.  Patient Active Problem List   Diagnosis Date Noted  . Obesity, unspecified 10/26/2018  . Family history of sickle cell disease     History reviewed. No pertinent surgical history.  Prior to Admission medications   Medication Sig Start Date End Date Taking? Authorizing Provider  ibuprofen (ADVIL,MOTRIN) 600 MG tablet Take 1 tablet (600 mg total) by mouth every 6 (six) hours. 09/07/18   Schermerhorn, Gwen Her, MD  nitrofurantoin, macrocrystal-monohydrate, (MACROBID) 100 MG capsule Take 1 capsule (100 mg total) by mouth 2 (two) times daily. 05/25/19   Fisher, Linden Dolin, PA-C  norethindrone (ORTHO MICRONOR) 0.35 MG tablet Take 1 tablet (0.35 mg total) by mouth daily. 09/07/18 09/07/19  Schermerhorn, Gwen Her, MD  norgestimate-ethinyl estradiol (ORTHO-CYCLEN) 0.25-35 MG-MCG tablet Take 1 tablet by mouth daily. 05/24/19   Hassell Done, FNP  Prenatal Vit-Fe Fumarate-FA (MULTIVITAMIN-PRENATAL) 27-0.8 MG TABS tablet Take 1 tablet by mouth daily at 12 noon.    [provider]    Allergies Patient has no known allergies.  Family History  Problem Relation Age of Onset  . Sickle cell anemia  Brother   . Diabetes Paternal Grandmother   . Arthritis Paternal Grandmother   . Diabetes Maternal Grandfather     Social History Social History   Tobacco Use  . Smoking status: Never Smoker  . Smokeless tobacco: Never Used  Substance Use Topics  . Alcohol use: Not Currently  . Drug use: Not Currently    Review of Systems  Constitutional: No fever/chills Eyes: No visual changes. ENT: No sore throat. Respiratory: Denies cough Genitourinary: Negative for dysuria. Musculoskeletal: Negative for back pain. Skin: Negative for rash.    ____________________________________________   PHYSICAL EXAM:  VITAL SIGNS: ED Triage Vitals  Enc Vitals Group     BP 05/25/19 1601 138/86     Pulse Rate 05/25/19 1601 (!) 109     Resp 05/25/19 1601 16     Temp 05/25/19 1601 98.1 F (36.7 C)     Temp Source 05/25/19 1601 Oral     SpO2 05/25/19 1601 100 %     Weight 05/25/19 1601 189 lb (85.7 kg)     Height 05/25/19 1601 5\' 4"  (1.626 m)     Head Circumference --      Peak Flow --      Pain Score 05/25/19 1600 4     Pain Loc --      Pain Edu? --      Excl. in [place]? --     Constitutional: Alert and oriented. Well appearing and in no acute distress. Eyes: Conjunctivae are normal.  Head: Atraumatic. Nose: No congestion/rhinnorhea. Mouth/Throat: Mucous membranes are moist.   Neck:  supple no lymphadenopathy  noted Cardiovascular: Normal rate, regular rhythm. Respiratory: Normal respiratory effort.  No retractions,  Abd: soft nontender bs normal all 4 quad GU: deferred by patient Musculoskeletal: FROM all extremities, warm and well perfused Neurologic:  Normal speech and language.  Skin:  Skin is warm, dry and intact. No rash noted. Psychiatric: Mood and affect are normal. Speech and behavior are normal.  ____________________________________________   LABS (all labs ordered are listed, but only abnormal results are displayed)  Labs Reviewed  URINALYSIS, COMPLETE (UACMP) WITH  MICROSCOPIC - Abnormal; Notable for the following components:      Result Value   Color, Urine YELLOW (*)    APPearance CLOUDY (*)    Leukocytes,Ua LARGE (*)    Bacteria, UA RARE (*)    All other components within normal limits  HCG, QUANTITATIVE, PREGNANCY  POCT PREGNANCY, URINE   ____________________________________________   ____________________________________________  RADIOLOGY    ____________________________________________   PROCEDURES  Procedure(s) performed: No  Procedures    ____________________________________________   INITIAL IMPRESSION / ASSESSMENT AND PLAN / ED COURSE  Pertinent labs & imaging results that were available during my care of the patient were reviewed by me and considered in my medical decision making (see chart for details).   Patient is 23 year old female presents emergency department requesting pregnancy test, see HPI.  Patient recently had a STD work-up at her doctor's office with no new exposure since then.  Physical exam patient appears well.  Poc preg negative, UA shows small amount of leuks, beta hCG is negative  Explained findings to the patient.  Explained to her this could be bladder infection.  Is placed on antibiotic.  She is to follow-up with her regular doctor.  States she understands will comply appears discharged stable condition.     Nancy Shaffer was evaluated in Emergency Department on 05/25/2019 for the symptoms described in the history of present illness. She was evaluated in the context of the global COVID-19 pandemic, which necessitated consideration that the patient might be at risk for infection with the SARS-CoV-2 virus that causes COVID-19. Institutional protocols and algorithms that pertain to the evaluation of patients at risk for COVID-19 are in a state of rapid change based on information released by regulatory bodies including the CDC and federal and state organizations. These policies and algorithms  were followed during the patient's care in the ED.   As part of my medical decision making, I reviewed the following data within the electronic MEDICAL RECORD NUMBER Nursing notes reviewed and incorporated, Labs reviewed see above, Notes from prior ED visits and [place] Controlled Substance Database  ____________________________________________   FINAL CLINICAL IMPRESSION(S) / ED DIAGNOSES  Final diagnoses:  Urinary tract infection without hematuria, site unspecified      NEW MEDICATIONS STARTED DURING THIS VISIT:  Discharge Medication List as of 05/25/2019  5:09 PM    START taking these medications   Details  nitrofurantoin, macrocrystal-monohydrate, (MACROBID) 100 MG capsule Take 1 capsule (100 mg total) by mouth 2 (two) times daily., Starting Thu 05/25/2019, Normal         Note:  This document was prepared using Dragon voice recognition software and may include unintentional dictation errors.    Faythe Ghee, PA-C 05/25/19 1736    Sharman Cheek, MD 05/25/19 [phone]

## 2019-05-25 NOTE — Discharge Instructions (Addendum)
Follow-up with your regular doctor if not better in 3 days.  Use medication as prescribed.  Return if worsening.

## 2019-06-21 ENCOUNTER — Other Ambulatory Visit: Payer: Self-pay

## 2019-06-21 ENCOUNTER — Ambulatory Visit (LOCAL_COMMUNITY_HEALTH_CENTER): Payer: Self-pay

## 2019-06-21 DIAGNOSIS — Z5321 Procedure and treatment not carried out due to patient leaving prior to being seen by health care provider: Secondary | ICD-10-CM

## 2019-06-21 DIAGNOSIS — Z3202 Encounter for pregnancy test, result negative: Secondary | ICD-10-CM

## 2019-06-21 LAB — PREGNANCY, URINE: Preg Test, Ur: NEGATIVE

## 2019-06-21 NOTE — Progress Notes (Signed)
Unable to locate client in large waiting room, small waiting room on Nurse Clinic hallway or in 1st floor bathroom. Phone call to lab and per Hilliard Clark, client was through in lab and sent to clinic "a while ago.". Per Hilliard Clark, UPT negative. Client was not seen by RN at this appt. Rich Number, RN

## 2020-01-31 ENCOUNTER — Ambulatory Visit: Payer: Medicaid Other | Admitting: Physician Assistant

## 2020-01-31 ENCOUNTER — Other Ambulatory Visit: Payer: Self-pay

## 2020-01-31 ENCOUNTER — Encounter: Payer: Self-pay | Admitting: Physician Assistant

## 2020-01-31 DIAGNOSIS — N760 Acute vaginitis: Secondary | ICD-10-CM

## 2020-01-31 DIAGNOSIS — A5901 Trichomonal vulvovaginitis: Secondary | ICD-10-CM

## 2020-01-31 DIAGNOSIS — Z113 Encounter for screening for infections with a predominantly sexual mode of transmission: Secondary | ICD-10-CM

## 2020-01-31 DIAGNOSIS — B9689 Other specified bacterial agents as the cause of diseases classified elsewhere: Secondary | ICD-10-CM

## 2020-01-31 LAB — WET PREP FOR TRICH, YEAST, CLUE
Trichomonas Exam: POSITIVE — AB
Yeast Exam: NEGATIVE

## 2020-01-31 MED ORDER — THERA VITAL M PO TABS: 1.0000 | ORAL_TABLET | Freq: Every day | ORAL | 0 refills | Status: DC

## 2020-01-31 MED ORDER — METRONIDAZOLE 500 MG PO TABS: 500.0000 mg | ORAL_TABLET | Freq: Two times a day (BID) | ORAL | 0 refills | Status: AC

## 2020-01-31 NOTE — Progress Notes (Signed)
Wet mount reviewed with provider, and pt treated for BV and Trich per Sadie Haber, PA order. Pt received MVI's per pt request. Provider orders completed.

## 2020-01-31 NOTE — Progress Notes (Signed)
Pt here for STD screening. Pt reports she feels like she may have BV.

## 2020-02-01 NOTE — Progress Notes (Signed)
[facility] Department STI clinic/screening visit  Subjective:  Nancy Shaffer is a 24 y.o. female being seen today for an STI screening visit. The patient reports they do have symptoms.  Patient reports that they do not desire a pregnancy in the next year.   They reported they are not interested in discussing contraception today.  Patient's last menstrual period was 01/19/2020 (exact date).   Patient has the following medical conditions:   Patient Active Problem List   Diagnosis Date Noted  . Obesity, unspecified 10/26/2018  . Family history of sickle cell disease     Chief Complaint  Patient presents with  . SEXUALLY TRANSMITTED DISEASE    screening    HPI  Patient reports that she has had white discharge, itching and odor for 2 weeks.  Denies other symptoms, chronic conditions, surgeries and regular medications.  Reports LMP 01/19/2020, last pap 2019-20 during pregnancy and unsure when last HIV test was done, likely during pregnancy as well.  See flowsheet for further details and programmatic requirements.    The following portions of the patient's history were reviewed and updated as appropriate: allergies, current medications, past medical history, past social history, past surgical history and problem list.  Objective:  There were no vitals filed for this visit.  Physical Exam Constitutional:      General: She is not in acute distress.    Appearance: Normal appearance.  HENT:     Head: Normocephalic and atraumatic.     Comments: No nits, lice, or hair loss. No cervical, supraclavicular or axillary adenopathy. Eyes:     Conjunctiva/sclera: Conjunctivae normal.  Pulmonary:     Effort: Pulmonary effort is normal.  Neurological:     Mental Status: She is alert and oriented to person, place, and time.  Psychiatric:        Mood and Affect: Mood normal.        Behavior: Behavior normal.        Thought Content: Thought content normal.        Judgment:  Judgment normal.    Patient opts to self-collect vaginal samples today.  Assessment and Plan:  Nancy Shaffer is a 24 y.o. female presenting to the [facility] Department for STI screening  1. Screening for STD (sexually transmitted disease) Patient into clinic with symptoms.  Declines blood work today. Rec condoms with all sex. Await test results.  Counseled that RN will call if needs to RTC for further treatment once results are back.  - WET PREP FOR TRICH, YEAST, CLUE - Chlamydia/Gonorrhea  Lab - Multiple Vitamins-Minerals (MULTIVITAMIN) tablet; Take 1 tablet by mouth daily.  Dispense: 100 tablet; Refill: 0  2. BV (bacterial vaginosis) Treat for BV with Metronidazole 500mg  #14 1 po BID for 7 days with food, no EtOH for 24 hr before and until 72 hr after completing medicine. No sex for 7 days and until after partner completes treatment. Enc to use OTC antifungal cream if has itching during or just after treatment with antibiotics. - metroNIDAZOLE (FLAGYL) 500 MG tablet; Take 1 tablet (500 mg total) by mouth 2 (two) times daily for 7 days.  Dispense: 14 tablet; Refill: 0  3. Trichomonal vaginitis See above . - metroNIDAZOLE (FLAGYL) 500 MG tablet; Take 1 tablet (500 mg total) by mouth 2 (two) times daily for 7 days.  Dispense: 14 tablet; Refill: 0     Return 2-3 weeks for TR's, 3 months for TOC, and PRN.  No future appointments.   Herbert Moors, PA

## 2020-02-07 ENCOUNTER — Other Ambulatory Visit: Payer: Self-pay

## 2020-02-07 ENCOUNTER — Ambulatory Visit: Payer: Medicaid Other

## 2020-02-07 VITALS — Wt 202.0 lb

## 2020-02-07 DIAGNOSIS — Z113 Encounter for screening for infections with a predominantly sexual mode of transmission: Secondary | ICD-10-CM | POA: Diagnosis not present

## 2020-02-07 DIAGNOSIS — A749 Chlamydial infection, unspecified: Secondary | ICD-10-CM | POA: Diagnosis not present

## 2020-02-07 DIAGNOSIS — A549 Gonococcal infection, unspecified: Secondary | ICD-10-CM

## 2020-02-07 MED ORDER — CEFTRIAXONE SODIUM 500 MG IJ SOLR
500.0000 mg | Freq: Once | INTRAMUSCULAR | Status: AC
  Administered 2020-02-07: 500 mg via INTRAMUSCULAR

## 2020-02-07 MED ORDER — AZITHROMYCIN 500 MG PO TABS
1000.0000 mg | ORAL_TABLET | Freq: Once | ORAL | Status: AC
  Administered 2020-02-07: 1000 mg via ORAL

## 2020-02-07 NOTE — Progress Notes (Signed)
Client declined partner card stating he is aware of need for treatment. Client counseled to remain in waiting room for 15 minutes following antibiotic injection. Jossie Ng, RN

## 2020-04-28 IMAGING — US US MFM OB COMPLETE +14 WKS
1 series · 13 of 28 positions shown · non-contrast
Comparison: none

PATIENT INFO:

PERFORMED BY:
NOMASIBULELE
SERVICE(S) PROVIDED:
INDICATIONS:
13 weeks gestation of pregnancy
1st trimester screening
FETAL EVALUATION:
Num Of Fetuses:     1
GESTATIONAL AGE:
LMP:           13w 0d        Date:  11/25/17                 EDD:   09/01/18
Best:          13w 0d     Det. By:  LMP  (11/25/17)          EDD:   09/01/18
1ST TRIMESTER GENETIC SONOGRAM SCREENING:
CRL:            67.7  mm    G. Age:   13w 0d                 EDD:   09/01/18
ANATOMY:
Choroid Plexus:        Within normal limits   Bladder:                Seen
for gestational age
Stomach:               Seen                   Upper Extremities:      Visualized
Abdominal Wall:        Within normal limits   Lower Extremities:      Visualized
CERVIX UTERUS ADNEXA:
Left Ovary
Size(cm)       3.6  x   2.5    x  3.5       Vol(ml):
Right Ovary
Size(cm)        3   x    4     x  3.5       Vol(ml): 22

[Series 1: us mfm ob complete +14 wks · 0.23mm/px · 13 of 80 slices shown]
[im 3/80]
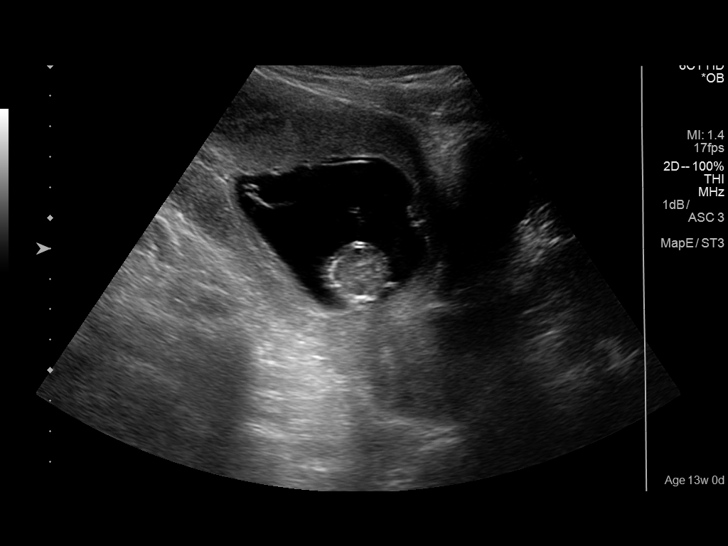
[im 9/80]
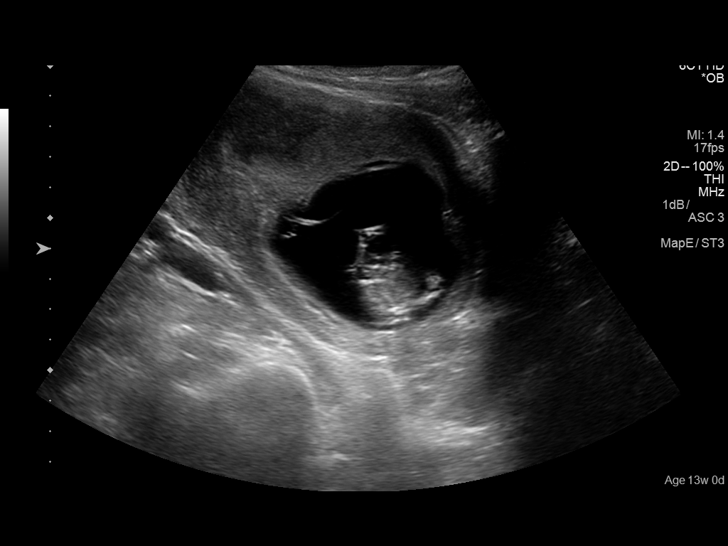
[im 15/80]
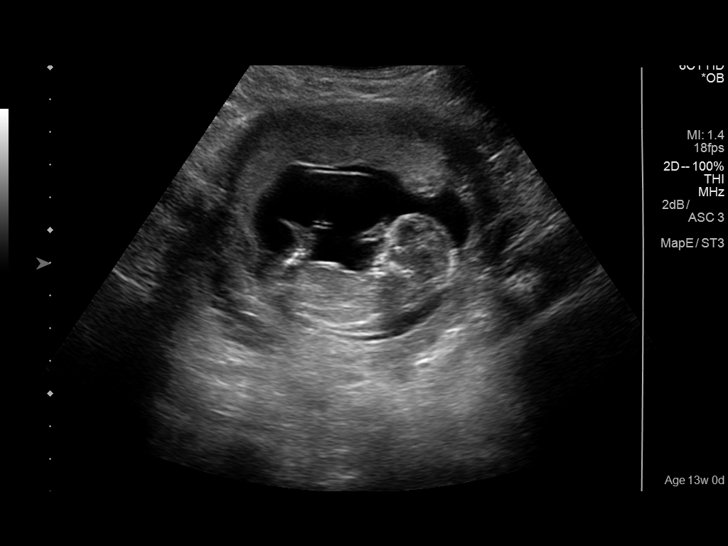
[im 21/80]
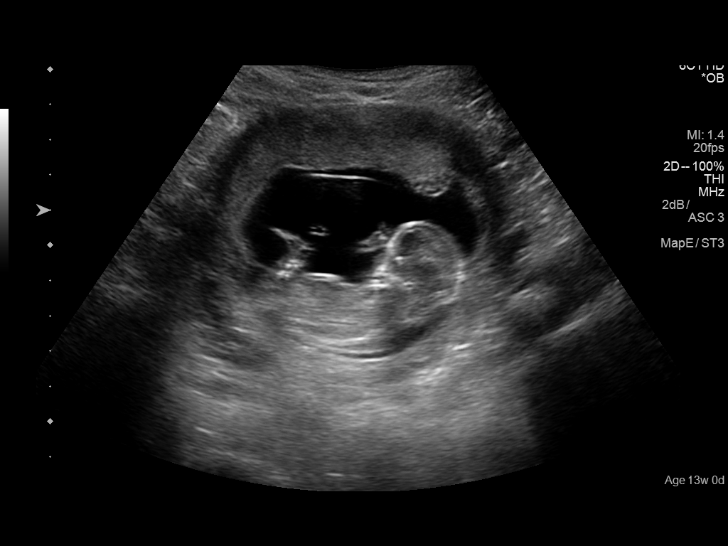
[im 27/80]
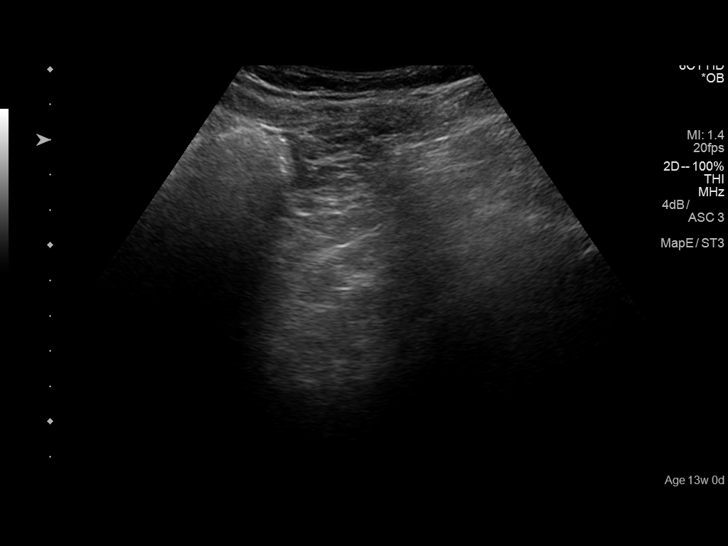
[im 33/80]
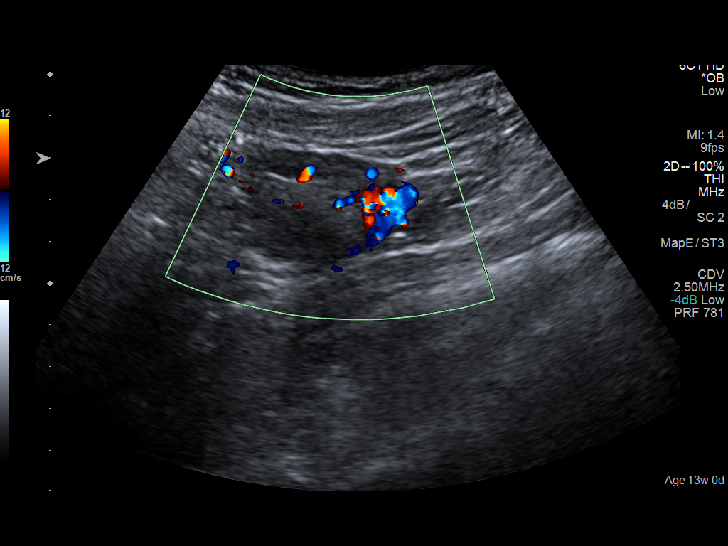
[im 41/80]
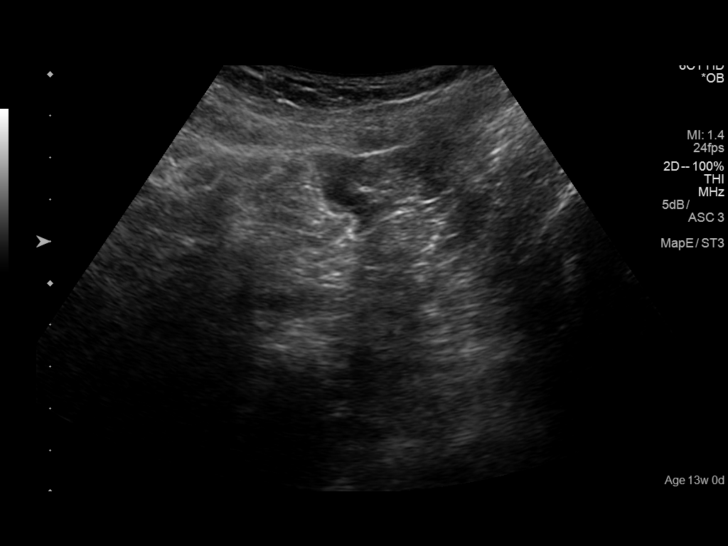
[im 47/80]
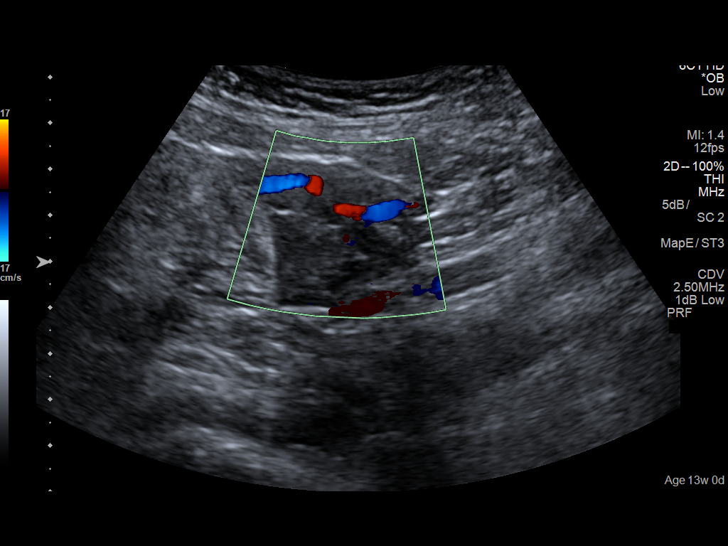
[im 53/80]
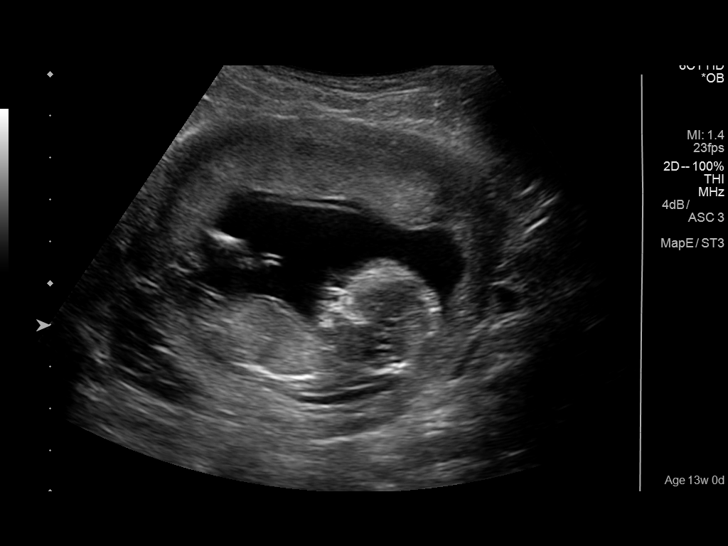
[im 59/80]
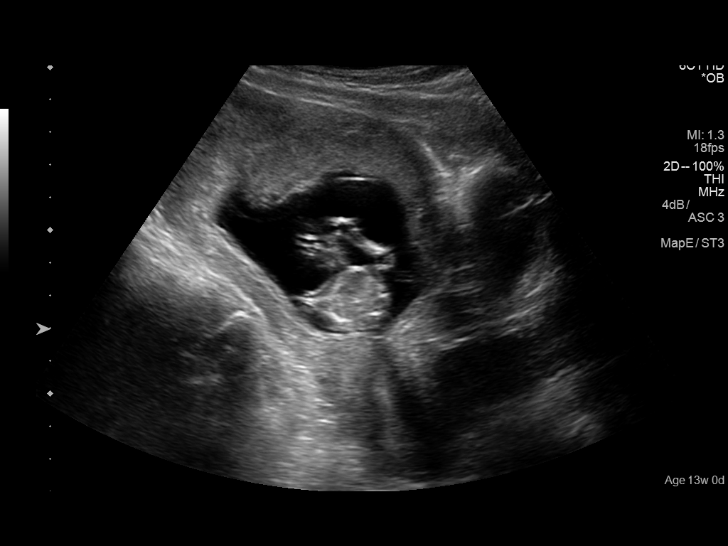
[im 65/80]
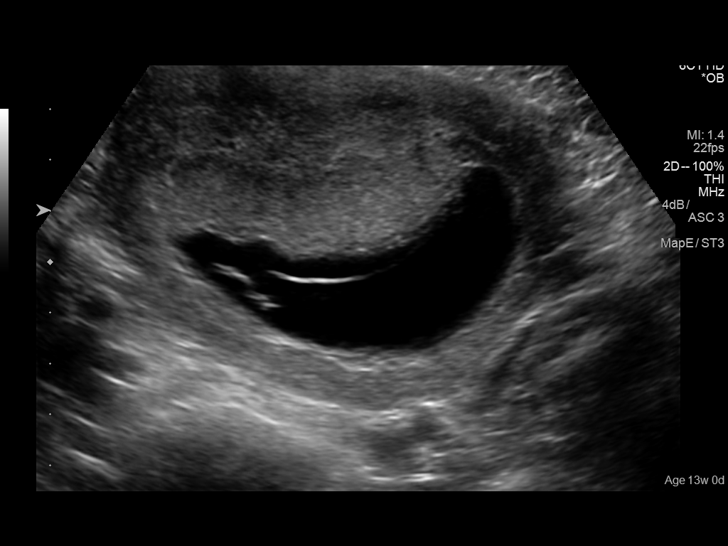
[im 71/80]
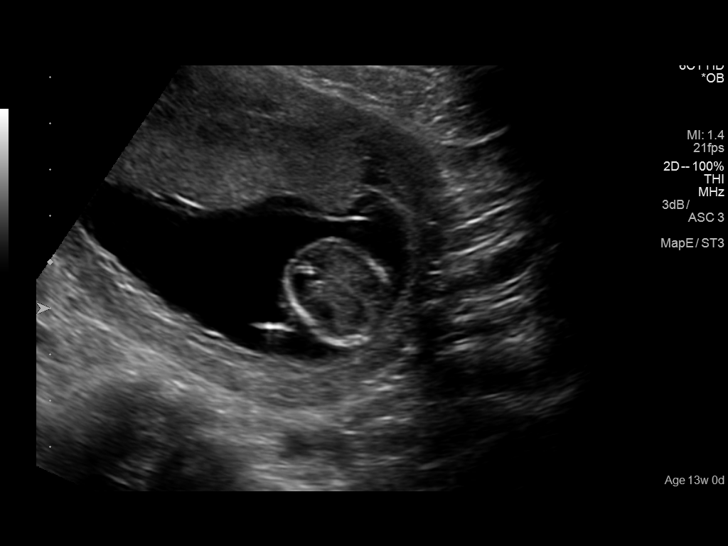
[im 77/80]
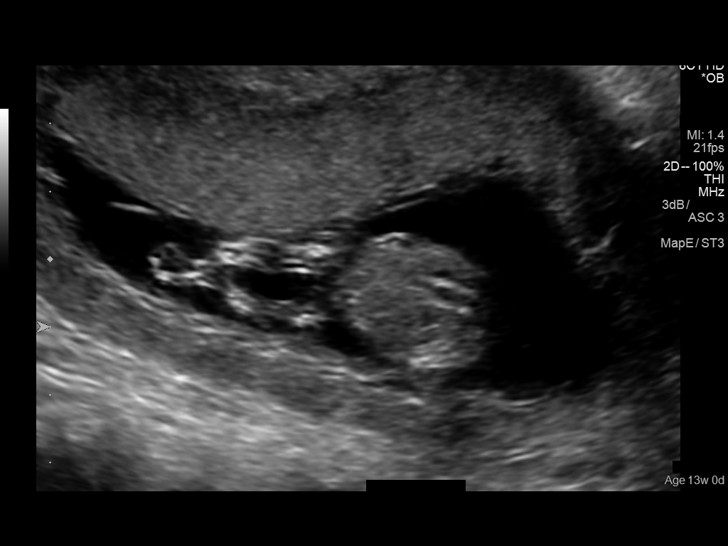

[13 of 28 positions shown; findings below may reference images not displayed]

IMPRESSION: Dear Ms. NOMASIBULELE,

Thank you for referring your patient to [facility] for first
trimester assessment.

The patient had the opportunity to meet with our genetic
counselor and declined any genetic screenin or testing.

A singleton gestation is noted at 83w7d by LMP confirmed by
US today.

The fetal biometry correlates with established dating.
The ovaries were WNL.
RECOMMENDATIONS:

The patient was scheduled to return in 5 weeks for anatomy
ultrasound.

## 2020-05-13 ENCOUNTER — Ambulatory Visit: Payer: Medicaid Other

## 2020-05-13 ENCOUNTER — Other Ambulatory Visit: Payer: Self-pay

## 2020-05-13 ENCOUNTER — Ambulatory Visit: Payer: Medicaid Other | Admitting: Family Medicine

## 2020-05-13 ENCOUNTER — Encounter: Payer: Self-pay | Admitting: Family Medicine

## 2020-05-13 VITALS — BP 112/72 | HR 95 | Ht 64.0 in | Wt 201.4 lb

## 2020-05-13 DIAGNOSIS — Z3009 Encounter for other general counseling and advice on contraception: Secondary | ICD-10-CM | POA: Diagnosis not present

## 2020-05-13 DIAGNOSIS — Z01419 Encounter for gynecological examination (general) (routine) without abnormal findings: Secondary | ICD-10-CM

## 2020-05-13 MED ORDER — PREPLUS 27-1 MG PO TABS: ORAL_TABLET | ORAL | 3 refills | Status: DC

## 2020-05-13 NOTE — Progress Notes (Signed)
[facility] [address] [place], [place] [address] Main Number: [phone]  Family Planning Visit- Initial Visit  Subjective:  Nancy Shaffer is a 24 y.o.  G2P2002  being seen today for an initial well woman visit and to discuss family planning options. Patient reports they do want a pregnancy in the next year.   Chief Complaint  Patient presents with   Annual Exam    Pt has Family history of sickle cell disease and Obesity, unspecified on their problem list.   HPI  Patient reports here for physical. She would like to conceive in this next year. Her last period was a little abnormal - 5 days late with pink/brown spotting only.  She had positive GC/CT in 01/2020. Open to retest today.   Patient's last menstrual period was 03/24/2020 (lmp unknown). Last sex: 04/13/20 BCM: none Pt desires EC? n/a  Last pap per pt/review of record: 05/2018: negative. Next due 05/2021.  Last HIV test per pt/review of record: 05/2018 Last tetanus vaccine: 02/2011 Covid vaccine: considering, hasn't gotten yet  Last breast exam: never Personal/family hx breast cancer? no  Patient reports 2 partner(s) in last year. Do they desire STI screening (if no, why not)? Ok w/rescreen gc/ct today only.   Does the patient desire a pregnancy in the next year? yes   24 y.o., Body mass index is 34.57 kg/m. - Is patient eligible for HA1C diabetes screening based on BMI and age >68?  no  HCV screening;       Has patient been screened once for HCV in the past?  no  No results found for: HCVAB      Does the patient have current drug use, have a partner with drug use, and/or has been incarcerated since last result? no If yes-- Screen for HCV through [facility]   Does the patient meet criteria for HBV testing? no Criteria:  -Household, sexual or needle sharing contact with HBV -History of drug use -HIV positive -Those with known Hep C  PHQ-2  score 0  See flowsheet for other program required questions.   Health Maintenance Due  Topic Date Due   Hepatitis C Screening  Never done   COVID-19 Vaccine (1) Never done   TETANUS/TDAP  Never done   INFLUENZA VACCINE  Never done    ROS 10 point review of systems is otherwise negative except as mentioned in HPI and listed below: HA: endorses increased stress, goes away on it's own  The following portions of the patient's history were reviewed and updated as appropriate: allergies, current medications, past family history, past medical history, past social history, past surgical history and problem list. Problem list updated.   See flowsheet for other program required questions.  Objective:   Vitals:   05/13/20 1509  BP: 112/72  Pulse: 95  Weight: 201 lb 6.4 oz (91.4 kg)  Height: 5\' 4"  (1.626 m)    Physical Exam Vitals and nursing note reviewed.  Constitutional:      Appearance: Normal appearance.  HENT:     Head: Normocephalic and atraumatic.     Mouth/Throat:     Mouth: Mucous membranes are moist.     Pharynx: Oropharynx is clear. No oropharyngeal exudate or posterior oropharyngeal erythema.  Eyes:     Conjunctiva/sclera: Conjunctivae normal.  Neck:     Thyroid: No thyroid mass, thyromegaly or thyroid tenderness.  Cardiovascular:     Rate and Rhythm: Normal rate and regular rhythm.  Pulses: Normal pulses.     Heart sounds: Normal heart sounds.  Pulmonary:     Effort: Pulmonary effort is normal.     Breath sounds: Normal breath sounds.  Chest:     Breasts:        Right: Normal. No swelling, mass, nipple discharge, skin change or tenderness.        Left: Normal. No swelling, mass, nipple discharge, skin change or tenderness.  Abdominal:     General: Abdomen is flat.     Palpations: There is no mass.     Tenderness: There is no abdominal tenderness. There is no rebound.  Genitourinary:    General: Normal vulva.     Exam position: Lithotomy position.      Pubic Area: No rash or pubic lice.      Labia:        Right: No rash or lesion.        Left: No rash or lesion.      Vagina: Normal. No vaginal erythema, bleeding or lesions. Vaginal discharge: clear/white, ph<4.5    Cervix: No cervical motion tenderness, discharge, friability, lesion or erythema.     Uterus: Normal.      Adnexa: Right adnexa normal and left adnexa normal.     Rectum: Normal.  Lymphadenopathy:     Head:     Right side of head: No preauricular or posterior auricular adenopathy.     Left side of head: No preauricular or posterior auricular adenopathy.     Cervical: No cervical adenopathy.     Upper Body:     Right upper body: No supraclavicular or axillary adenopathy.     Left upper body: No supraclavicular or axillary adenopathy.     Lower Body: No right inguinal adenopathy. No left inguinal adenopathy.  Skin:    General: Skin is warm and dry. +tattoos    Findings: No rash.  Neurological:     Mental Status: She is alert and oriented to person, place, and time.     Assessment and Plan:  [name] is a 24 y.o. female presenting to the [facility] Department for an initial well woman exam/family planning visit.  Contraception counseling: Reviewed all forms of birth control options in the tiered based approach. available including abstinence; over the counter/barrier methods; hormonal contraceptive medication including pill, patch, ring, injection,contraceptive implant, ECP; hormonal and nonhormonal IUDs; permanent sterilization options including vasectomy and the various tubal sterilization modalities. Risks, benefits, and typical effectiveness rates were reviewed.  Questions were answered.  Written information was also given to the patient to review.  Patient desires nothing, is hoping for pregnancy. She will follow up in  1 year for surveillance.  She was told to call with any further questions, or with any concerns about this method of  contraception.  Emphasized use of condoms 100% of the time for STI prevention.  Emergency Contraception: n/a   1. Well woman exam -BCM: Pt declines all BCM, hoping for pregnancy. Preconception counseling given - advised taking PNV w/folic acid, encouraged healthy diet and lifestyle. -Pap: due in 1 yr -CBE: done today. "Active FYIs" info up to date. Recommended screening mammograms beginning at age 71 -STI screening: pt accepts rescreen gc/ct only. -Hepatitis B/C screening: pt does not qualify -A1c screening: n/a -Recommended PCP f/u for HA if they persist. - Prenatal Vit-Fe Fumarate-FA (PREPLUS) 27-1 MG TABS; Take 1 tablet by mouth daily while trying to conceive or pregnant.  Dispense: 100 tablet; Refill: 3  Return in about 1 year (around 05/13/2021) for yearly wellness exam.  No future appointments.  Ann Held, PA-C

## 2021-08-15 ENCOUNTER — Other Ambulatory Visit: Payer: Self-pay

## 2021-08-15 ENCOUNTER — Ambulatory Visit (LOCAL_COMMUNITY_HEALTH_CENTER): Payer: Medicaid Other

## 2021-08-15 VITALS — BP 121/82 | Ht 64.0 in | Wt 210.0 lb

## 2021-08-15 DIAGNOSIS — Z3201 Encounter for pregnancy test, result positive: Secondary | ICD-10-CM

## 2021-08-15 LAB — PREGNANCY, URINE: Preg Test, Ur: POSITIVE — AB

## 2021-08-15 MED ORDER — PRENATAL 27-0.8 MG PO TABS: 1.0000 | ORAL_TABLET | Freq: Every day | ORAL | 0 refills | Status: AC

## 2021-08-15 NOTE — Progress Notes (Signed)
UPT positive. Plans prenatal care at [facility]. To clerk for preadmit. Adamariz Gillott, RN  

## 2021-08-17 NOTE — L&D Delivery Note (Incomplete)
Delivery Note  Date of delivery: 03/07/2022 Estimated Date of Delivery: 03/16/22 Patient's last menstrual period was 06/09/2021 (exact date). EGA: [redacted]w[redacted]d  Delivery Note At 11:43 PM a viable female was delivered via Vaginal, Spontaneous (Presentation: OA).  APGAR: 9, 9; weight  pending.  Placenta status: Spontaneous, Intact.  Cord: 3 vessels with the following complications: None.    First Stage: Labor onset: 0630 Augmentation : none Analgesia /Anesthesia intrapartum: none SROM at 0600  Nancy Shaffer presented to L&D with active labor and SROM. She was expectantly managed.    Second Stage: Complete dilation at 0630 Onset of pushing at 2342 FHR second stage Cat I Delivery at 2343 on 03/07/2022  She progressed to complete and had a spontaneous vaginal birth of a live fe***female over an intact perineum***episiotomy performed under local***epidural anesthesia. The fetal head was delivered in *** position with restitution to ***. *** nuchal cord. Anterior then posterior shoulders delivered spontaneously***with minimal assistance. Baby placed on mom's abdomen and attended to by peds***transition RN. Cord clamped and cut when pulseless*** by ***. ***Cord segment given to nurse for gases. ***Cord blood obtained for newborn labs.  Cord blood collected for donation.***  Third Stage: Placenta delivered *** intact with ***VC at *** Placenta disposition: *** Uterine tone *** / bleeding *** ***IV pitocin given for hemorrhage prophylaxis  Anesthesia: None Episiotomy: None Lacerations: None Suture Repair: n/a Est. Blood Loss (mL):  ***  Complications: ***  Mom to {mom status:3041454}.  Baby to {baby status:3041455}.  Newborn: Birth Weight: ***  Apgar Scores: *** Feeding planned: ***   [name], CNM 03/07/2022 12:01 AM

## 2021-08-17 NOTE — L&D Delivery Note (Signed)
Delivery Note  Date of delivery: 03/07/2022 Estimated Date of Delivery: 03/16/22 Patient's last menstrual period was 06/09/2021 (exact date). EGA: [redacted]w[redacted]d  Delivery Note At 11:43 PM a viable female was delivered via Vaginal, Spontaneous (Presentation: OA).  APGAR: 9, 9; weight  pending.  Placenta status: Spontaneous, Intact.  Cord: 3 vessels with the following complications: None.    First Stage: Labor onset: 0630 Augmentation : none Analgesia /Anesthesia intrapartum: none SROM at 0600  Nancy Shaffer presented to L&D with active labor and SROM. She was expectantly managed.    Second Stage: Complete dilation at 0630 Onset of pushing at 2342 FHR second stage Cat I Delivery at 2343 on 03/07/2022  She progressed to complete and had a spontaneous vaginal birth of a live female over an intact perineum. The fetal head was delivered in OA position with restitution to LOA. No nuchal cord. Anterior then posterior shoulders delivered spontaneously. Baby placed on mom's abdomen and attended to by transition RN. Cord clamped and cut after 1 min by FOB.   Third Stage: Placenta delivered intact with 3VC at 2349 Placenta disposition: routine disposal Uterine tone firm / bleeding min IV pitocin given for hemorrhage prophylaxis  Anesthesia: None Episiotomy: None Lacerations: None Suture Repair: n/a Est. Blood Loss (mL):  200  Complications: none  Mom to postpartum.  Baby to Couplet care / Skin to Skin.  Newborn: Birth Weight: pending  Apgar Scores: 9, 9 Feeding planned: Breast and Bottle   [name], CNM 03/07/2022 12:01 AM

## 2021-08-27 ENCOUNTER — Encounter: Payer: Self-pay | Admitting: Advanced Practice Midwife

## 2021-08-27 ENCOUNTER — Ambulatory Visit: Payer: Medicaid Other | Admitting: Advanced Practice Midwife

## 2021-08-27 ENCOUNTER — Other Ambulatory Visit: Payer: Self-pay

## 2021-08-27 VITALS — BP 99/64 | HR 83 | Temp 97.4°F | Wt 209.8 lb

## 2021-08-27 DIAGNOSIS — Z3481 Encounter for supervision of other normal pregnancy, first trimester: Secondary | ICD-10-CM | POA: Diagnosis not present

## 2021-08-27 DIAGNOSIS — O99211 Obesity complicating pregnancy, first trimester: Secondary | ICD-10-CM

## 2021-08-27 DIAGNOSIS — O23599 Infection of other part of genital tract in pregnancy, unspecified trimester: Secondary | ICD-10-CM | POA: Insufficient documentation

## 2021-08-27 DIAGNOSIS — A5901 Trichomonal vulvovaginitis: Secondary | ICD-10-CM | POA: Insufficient documentation

## 2021-08-27 DIAGNOSIS — A599 Trichomoniasis, unspecified: Secondary | ICD-10-CM

## 2021-08-27 DIAGNOSIS — O99210 Obesity complicating pregnancy, unspecified trimester: Secondary | ICD-10-CM

## 2021-08-27 DIAGNOSIS — O0990 Supervision of high risk pregnancy, unspecified, unspecified trimester: Secondary | ICD-10-CM | POA: Insufficient documentation

## 2021-08-27 LAB — WET PREP FOR TRICH, YEAST, CLUE
Trichomonas Exam: POSITIVE — AB
Yeast Exam: NEGATIVE

## 2021-08-27 LAB — URINALYSIS
Bilirubin, UA: NEGATIVE
Glucose, UA: NEGATIVE
Ketones, UA: NEGATIVE
Nitrite, UA: NEGATIVE
RBC, UA: NEGATIVE
Specific Gravity, UA: 1.03 (ref 1.005–1.030)
Urobilinogen, Ur: 0.2 mg/dL (ref 0.2–1.0)
pH, UA: 5.5 (ref 5.0–7.5)

## 2021-08-27 LAB — HEMOGLOBIN, FINGERSTICK: Hemoglobin: 11.2 g/dL (ref 11.1–15.9)

## 2021-08-27 MED ORDER — METRONIDAZOLE 500 MG PO TABS: 500.0000 mg | ORAL_TABLET | Freq: Two times a day (BID) | ORAL | 0 refills | Status: AC

## 2021-08-27 NOTE — Progress Notes (Signed)
Treatment given for Trichomoniasis per standing order.   Urinalysis reviewed.   Hgb reviewed - no treatment indicated.   Floy Sabina, RN

## 2021-08-27 NOTE — Progress Notes (Signed)
[place] [facility]  [facility]   INITIAL PRENATAL VISIT NOTE  Subjective:  Nancy Shaffer is a 26 y.o. SBF nonsmoker G3P2002 (9, 2) at [redacted]w[redacted]d being seen today to start prenatal care at the [facility] Department. She feels "good" about surprise pregnancy with no birth control. 27 yo FOB feels "I don't know, we aren't together" after having a "1 night stand" with FOB who will not be supportive. She refuses to give any more info about this man. She is living with her 2 kids and working 1 day/wk x 8 hours/wk with [employer]; father of her other 2 children is not supportive and she wants nothing to do with him as well. Denies u/s or ER use this pregnancy. Denies pica, cigs, vaping, cigars. Last MJ summer 2022. Last ETOH 07/19/21 (4 shots vodka) qo month.  LMP 07/10/21.  Here with toddler daughter.  She is currently monitored for the following issues for this low-risk pregnancy and has Family history of sickle cell disease; Obesity affecting pregnancy; and Prenatal care, subsequent pregnancy, first trimester on their problem list.  Patient reports  malodorous d/c since discovered pregnancy .  Contractions: Not present. Vag. Bleeding: None.  Movement: Absent. Denies leaking of fluid.   Indications for ASA therapy (per uptodate) One of the following: Previous pregnancy with preeclampsia, especially early onset and with an adverse outcome No Multifetal gestation No Chronic hypertension No Type 1 or 2 diabetes mellitus No Chronic kidney disease No Autoimmune disease (antiphospholipid syndrome, systemic lupus erythematosus) No  Two or more of the following: Nulliparity No Obesity (body mass index >30 kg/m2) yes Family history of preeclampsia in mother or sister No Age ?35 years No Sociodemographic characteristics (African American race, low socioeconomic level) Yes Personal risk factors (eg, previous pregnancy with low birth weight or small for  gestational age infant, previous adverse pregnancy outcome [eg, stillbirth], interval >10 years between pregnancies) No   The following portions of the patient's history were reviewed and updated as appropriate: allergies, current medications, past family history, past medical history, past social history, past surgical history and problem list. Problem list updated.  Objective:   Vitals:   08/27/21 0836  BP: 99/64  Pulse: 83  Temp: (!) 97.4 F (36.3 C)  Weight: 209 lb 12.8 oz (95.2 kg)    Fetal Status: Fetal Heart Rate (bpm): 160 Fundal Height: 16 cm Movement: Absent  Presentation: Undeterminable   Physical Exam Vitals and nursing note reviewed.  Constitutional:      General: She is not in acute distress.    Appearance: Normal appearance. She is well-developed. She is obese.  HENT:     Head: Normocephalic and atraumatic.     Right Ear: External ear normal.     Left Ear: External ear normal.     Nose: Nose normal. No congestion or rhinorrhea.     Mouth/Throat:     Lips: Pink.     Mouth: Mucous membranes are moist.     Dentition: Normal dentition. No dental caries.     Pharynx: Oropharynx is clear. Uvula midline.     Comments: Dentition: good Eyes:     General: No scleral icterus.    Conjunctiva/sclera: Conjunctivae normal.  Neck:     Thyroid: No thyroid mass, thyromegaly or thyroid tenderness.  Cardiovascular:     Rate and Rhythm: Normal rate.     Pulses: Normal pulses.     Comments: Extremities are warm and well perfused Pulmonary:  Effort: Pulmonary effort is normal.     Breath sounds: Normal breath sounds.  Chest:     Chest wall: No mass.  Breasts:    Tanner Score is 5.     Breasts are symmetrical.     Right: Normal. No mass, nipple discharge or skin change.     Left: Normal. No mass, nipple discharge or skin change.  Abdominal:     Palpations: Abdomen is soft.     Tenderness: There is no abdominal tenderness.     Comments: Gravid, soft without masses or  tenderness, fundus 1/2 S&U, FHR=160  Genitourinary:    General: Normal vulva.     Exam position: Lithotomy position.     Pubic Area: No rash.      Labia:        Right: No rash.        Left: No rash.      Vagina: Vaginal discharge (grey moderate sl malodorous leukorrhea, ph>4.5) present.     Cervix: Normal.     Uterus: Normal. Enlarged (Gravid 16 wks size). Not tender.      Rectum: External hemorrhoid (small flesh colored) present.     Comments: Pap done Musculoskeletal:     Right lower leg: No edema.     Left lower leg: No edema.  Lymphadenopathy:     Cervical: No cervical adenopathy.     Upper Body:     Right upper body: No axillary adenopathy.     Left upper body: No axillary adenopathy.  Skin:    General: Skin is warm.     Capillary Refill: Capillary refill takes less than 2 seconds.  Neurological:     Mental Status: She is alert.    Assessment and Plan:  Pregnancy: G3P2002 at [redacted]w[redacted]d  1. Obesity affecting pregnancy, antepartum Counseled on total weight gain of 11-20 lbs this pregnancy Agrees to daily ASA 81 mg daily to begin next week  2. Prenatal care, subsequent pregnancy, first trimester Dating u/s ordered Needs 1 hour glucola today Denies pica  - HIV-1/HIV-2 Qualitative RNA - Prenatal Profile I - Varicella zoster antibody, IgG - HCV Ab w Reflex to Quant PCR - Urine Culture - Chlamydia/GC NAA, Confirmation - Glucose, 1 hour gestational - Hgb A1c w/o eAG - Comprehensive metabolic panel - TSH - Hemoglobinopathy evaluation [id number] - Urinalysis (Urine Dip) - Hemoglobin, venipuncture - WET PREP FOR TRICH, YEAST, CLUE    Discussed overview of care and coordination with inpatient delivery practices including [facility], [facility], [facility] and [facility].   Reviewed Centering pregnancy as standard of care at [facility]   Preterm labor symptoms and general obstetric precautions including but not limited to vaginal bleeding, contractions, leaking of fluid and  fetal movement were reviewed in detail with the patient.  Please refer to After Visit Summary for other counseling recommendations.   No follow-ups on file.  No future appointments.  Herbie Saxon, CNM

## 2021-08-29 ENCOUNTER — Encounter: Payer: Self-pay | Admitting: Advanced Practice Midwife

## 2021-08-29 DIAGNOSIS — R87615 Unsatisfactory cytologic smear of cervix: Secondary | ICD-10-CM | POA: Insufficient documentation

## 2021-08-29 LAB — HIV-1/HIV-2 QUALITATIVE RNA
HIV-1 RNA, Qualitative: NONREACTIVE
HIV-2 RNA, Qualitative: NONREACTIVE

## 2021-08-29 LAB — IGP, RFX APTIMA HPV ASCU: PAP Smear Comment: 0

## 2021-08-29 LAB — URINE CULTURE

## 2021-08-29 LAB — HGB FRACTIONATION CASCADE
Hgb A2: 2.3 % (ref 1.8–3.2)
Hgb A: 97.7 % (ref 96.4–98.8)
Hgb F: 0 % (ref 0.0–2.0)
Hgb S: 0 %

## 2021-08-29 LAB — CHLAMYDIA/GC NAA, CONFIRMATION
Chlamydia trachomatis, NAA: NEGATIVE
Neisseria gonorrhoeae, NAA: NEGATIVE

## 2021-09-02 LAB — COMPREHENSIVE METABOLIC PANEL
ALT: 5 IU/L (ref 0–32)
AST: 8 IU/L (ref 0–40)
Albumin/Globulin Ratio: 1.3 (ref 1.2–2.2)
Albumin: 4 g/dL (ref 3.9–5.0)
Alkaline Phosphatase: 67 IU/L (ref 44–121)
BUN/Creatinine Ratio: 7 — ABNORMAL LOW (ref 9–23)
BUN: 5 mg/dL — ABNORMAL LOW (ref 6–20)
Bilirubin Total: 0.5 mg/dL (ref 0.0–1.2)
CO2: 21 mmol/L (ref 20–29)
Calcium: 8.8 mg/dL (ref 8.7–10.2)
Chloride: 102 mmol/L (ref 96–106)
Creatinine, Ser: 0.76 mg/dL (ref 0.57–1.00)
Globulin, Total: 3 g/dL (ref 1.5–4.5)
Glucose: 88 mg/dL (ref 70–99)
Potassium: 3.6 mmol/L (ref 3.5–5.2)
Sodium: 135 mmol/L (ref 134–144)
Total Protein: 7 g/dL (ref 6.0–8.5)
eGFR: 111 mL/min/{1.73_m2} (ref >59)

## 2021-09-02 LAB — PRENATAL PROFILE I(LABCORP)
Antibody Screen: NEGATIVE
Basophils Absolute: 0 10*3/uL (ref 0.0–0.2)
Basos: 0 %
EOS (ABSOLUTE): 0 10*3/uL (ref 0.0–0.4)
Eos: 0 %
Hematocrit: 33.9 % — ABNORMAL LOW (ref 34.0–46.6)
Hemoglobin: 11.1 g/dL (ref 11.1–15.9)
Hepatitis B Surface Ag: NEGATIVE
Immature Grans (Abs): 0 10*3/uL (ref 0.0–0.1)
Immature Granulocytes: 0 %
Lymphocytes Absolute: 2.2 10*3/uL (ref 0.7–3.1)
Lymphs: 25 %
MCH: 26.9 pg (ref 26.6–33.0)
MCHC: 32.7 g/dL (ref 31.5–35.7)
MCV: 82 fL (ref 79–97)
Monocytes Absolute: 0.6 10*3/uL (ref 0.1–0.9)
Monocytes: 6 %
Neutrophils Absolute: 6.1 10*3/uL (ref 1.4–7.0)
Neutrophils: 69 %
Platelets: 425 10*3/uL (ref 150–450)
RBC: 4.12 x10E6/uL (ref 3.77–5.28)
RDW: 14.5 % (ref 11.7–15.4)
RPR Ser Ql: NONREACTIVE
Rh Factor: POSITIVE
Rubella Antibodies, IGG: 2.83 index (ref >0.99)
WBC: 8.9 10*3/uL (ref 3.4–10.8)

## 2021-09-02 LAB — HGB A1C W/O EAG: Hgb A1c MFr Bld: 5.1 % (ref 4.8–5.6)

## 2021-09-02 LAB — VARICELLA ZOSTER ANTIBODY, IGG: Varicella zoster IgG: 674 index (ref >165)

## 2021-09-02 LAB — TSH: TSH: 1.95 u[IU]/mL (ref 0.450–4.500)

## 2021-09-02 LAB — HCV AB W REFLEX TO QUANT PCR: HCV Ab: <0.1 s/co ratio (ref 0.0–0.9)

## 2021-09-02 LAB — HCV INTERPRETATION

## 2021-09-02 LAB — GLUCOSE, 1 HOUR GESTATIONAL: Gestational Diabetes Screen: 102 mg/dL (ref 70–139)

## 2021-09-11 NOTE — Progress Notes (Signed)
[facility] U/S referral refaxed at request of [facility] u/s . Confirmation received.Burt Knack, RN

## 2021-09-15 ENCOUNTER — Telehealth: Payer: Self-pay

## 2021-09-15 NOTE — Telephone Encounter (Signed)
TC to patient to schedule her for a maternity revisit. Patient had her new OB on 08/27/21. Now scheduled to return for MH RV on 09/24/21. Patient counseled to arrive at 0845.Marland KitchenMarland KitchenMarland KitchenBurt Knack, RN

## 2021-09-16 ENCOUNTER — Encounter: Payer: Self-pay | Admitting: Advanced Practice Midwife

## 2021-09-24 ENCOUNTER — Other Ambulatory Visit: Payer: Self-pay

## 2021-09-24 ENCOUNTER — Ambulatory Visit: Payer: Medicaid Other | Admitting: Family Medicine

## 2021-09-24 VITALS — BP 117/74 | HR 97 | Temp 97.3°F | Wt 214.0 lb

## 2021-09-24 DIAGNOSIS — O0992 Supervision of high risk pregnancy, unspecified, second trimester: Secondary | ICD-10-CM

## 2021-09-24 DIAGNOSIS — O99212 Obesity complicating pregnancy, second trimester: Secondary | ICD-10-CM

## 2021-09-24 DIAGNOSIS — O219 Vomiting of pregnancy, unspecified: Secondary | ICD-10-CM

## 2021-09-24 DIAGNOSIS — A599 Trichomoniasis, unspecified: Secondary | ICD-10-CM

## 2021-09-24 LAB — WET PREP FOR TRICH, YEAST, CLUE
Trichomonas Exam: NEGATIVE
Yeast Exam: NEGATIVE

## 2021-09-24 MED ORDER — ONDANSETRON 4 MG PO TBDP: 4.0000 mg | ORAL_TABLET | Freq: Three times a day (TID) | ORAL | 1 refills | Status: DC | PRN

## 2021-09-24 MED ORDER — FAMOTIDINE 20 MG PO TABS: 20.0000 mg | ORAL_TABLET | Freq: Two times a day (BID) | ORAL | 6 refills | Status: DC

## 2021-09-24 NOTE — Progress Notes (Signed)
[place] [facility]  PRENATAL VISIT NOTE  Subjective:  Nancy Shaffer is a 26 y.o. G3P2002 at [redacted]w[redacted]d being seen today for ongoing prenatal care.  She is currently monitored for the following issues for this high-risk pregnancy and has Family history of sickle cell disease; Obesity affecting pregnancy BMI=36.0; Supervision of high risk pregnancy, antepartum; Trichomoniasis 08/27/21; and Pap smear of cervix unsatisfactory because of inflammation (trich) on their problem list.  Patient reports persistent nausea/vomiting.  Contractions: Not present. Vag. Bleeding: None.  Movement: Present. Denies leaking of fluid/ROM.   The following portions of the patient's history were reviewed and updated as appropriate: allergies, current medications, past family history, past medical history, past social history, past surgical history and problem list. Problem list updated.  Objective:   Vitals:   09/24/21 0907  BP: 117/74  Pulse: 97  Temp: (!) 97.3 F (36.3 C)  Weight: 214 lb (97.1 kg)    Fetal Status: Fetal Heart Rate (bpm): 165 Fundal Height: 16 cm Movement: Present     General:  Alert, oriented and cooperative. Patient is in no acute distress.  Skin: Skin is warm and dry. No rash noted.   Cardiovascular: Normal heart rate noted  Respiratory: Normal respiratory effort, no problems with respiration noted  Abdomen: Soft, gravid, appropriate for gestational age.  Pain/Pressure: Absent     Pelvic: Cervical exam deferred        Extremities: Normal range of motion.  Edema: None  Mental Status: Normal mood and affect. Normal behavior. Normal judgment and thought content.   Assessment and Plan:  Pregnancy: G3P2002 at [redacted]w[redacted]d  1. Pregnancy, supervision, high-risk, second trimester UTD Having N/V-- see below for plan Needs anatomy scan scheduled-- ordered on paper form for [facility] Anatomy scan Unable to review 1/27 [place] results, RN request Defer QUAD until next  visit - CBC with Differential/Platelet  2. Obesity affecting pregnancy in second trimester TWG=8 lb (3.629 kg) which is at goal   3. Trichomoniasis 08/27/21 No contact with partner since treatment, partner was informed No Sx today - WET PREP FOR [place], [place], CLUE  4. Nausea and vomiting during pregnancy Reviewed medications and will trial H2 blocker and prn zofran Counseled about small frequent meals - famotidine (PEPCID) 20 MG tablet; Take 1 tablet (20 mg total) by mouth 2 (two) times daily.  Dispense: 60 tablet; Refill: 6 - ondansetron (ZOFRAN-ODT) 4 MG disintegrating tablet; Take 1 tablet (4 mg total) by mouth every 8 (eight) hours as needed for nausea or vomiting.  Dispense: 20 tablet; Refill: 1    Preterm labor symptoms and general obstetric precautions including but not limited to vaginal bleeding, contractions, leaking of fluid and fetal movement were reviewed in detail with the patient. Please refer to After Visit Summary for other counseling recommendations.  Return in about 4 weeks (around 10/22/2021) for Routine prenatal care+QUAD screen .  No future appointments.  Caren Macadam, MD

## 2021-09-24 NOTE — Progress Notes (Addendum)
Here today for 15.2 week MH RV. Taking PNV QD. Denies ED/hospital visits since last appt. Kept 09/12/21 [place] appt. Needs CBC drawn ~ 09/27/21. Wants Quad screen today. Tawny Hopping, RN

## 2021-09-25 LAB — CBC WITH DIFFERENTIAL/PLATELET
Basophils Absolute: 0 10*3/uL (ref 0.0–0.2)
Basos: 0 %
EOS (ABSOLUTE): 0 10*3/uL (ref 0.0–0.4)
Eos: 0 %
Hematocrit: 34.4 % (ref 34.0–46.6)
Hemoglobin: 10.8 g/dL — ABNORMAL LOW (ref 11.1–15.9)
Immature Grans (Abs): 0 10*3/uL (ref 0.0–0.1)
Immature Granulocytes: 0 %
Lymphocytes Absolute: 2.5 10*3/uL (ref 0.7–3.1)
Lymphs: 23 %
MCH: 26.6 pg (ref 26.6–33.0)
MCHC: 31.4 g/dL — ABNORMAL LOW (ref 31.5–35.7)
MCV: 85 fL (ref 79–97)
Monocytes Absolute: 0.5 10*3/uL (ref 0.1–0.9)
Monocytes: 5 %
Neutrophils Absolute: 7.6 10*3/uL — ABNORMAL HIGH (ref 1.4–7.0)
Neutrophils: 72 %
Platelets: 394 10*3/uL (ref 150–450)
RBC: 4.06 x10E6/uL (ref 3.77–5.28)
RDW: 14.2 % (ref 11.7–15.4)
WBC: 10.7 10*3/uL (ref 3.4–10.8)

## 2021-09-30 ENCOUNTER — Encounter: Payer: Self-pay | Admitting: Family Medicine

## 2021-09-30 ENCOUNTER — Telehealth: Payer: Self-pay

## 2021-09-30 DIAGNOSIS — O99019 Anemia complicating pregnancy, unspecified trimester: Secondary | ICD-10-CM | POA: Insufficient documentation

## 2021-09-30 NOTE — Telephone Encounter (Signed)
Per scheduler, anatomy US scheduled for 10/28/2021 at 1600 and client aware of appt. Rich Number, RN

## 2021-10-28 ENCOUNTER — Telehealth: Payer: Self-pay | Admitting: Family Medicine

## 2021-10-28 ENCOUNTER — Other Ambulatory Visit: Payer: Self-pay | Admitting: Family Medicine

## 2021-10-28 DIAGNOSIS — O0990 Supervision of high risk pregnancy, unspecified, unspecified trimester: Secondary | ICD-10-CM

## 2021-10-28 DIAGNOSIS — O99212 Obesity complicating pregnancy, second trimester: Secondary | ICD-10-CM

## 2021-10-28 NOTE — Telephone Encounter (Addendum)
Phone call returned to patient informing her that RN has contacted [facility] and spoken with Mitzi which informed RN that [facility] did not accept her type of insurance. Patient states she is upset due to not finding out the gender of her baby today. RN explained that anatomy ultrasound is scheduled to assess anatomy of baby and not gender. Patient then states "I wanted to know the gender of my baby." RN explained that provider Elveria Rising, FNP was informed of above and clinic will reschedule anatomy scan at [facility] at [facility] or [facility] and that as soon as we have that appt she will be notified. Patient agreeable to above. Tawny Hopping, RN ?  ?

## 2021-10-28 NOTE — Telephone Encounter (Signed)
Pt. has appt for ultrasound today but [facility] just called to tell her that they wouldn't be able to see her and she would like to speak with one of the nurses about what the problem is. ?

## 2021-10-28 NOTE — Progress Notes (Signed)
MFM

## 2021-10-30 ENCOUNTER — Telehealth: Payer: Self-pay | Admitting: Family Medicine

## 2021-10-30 NOTE — Telephone Encounter (Signed)
Please refer to phone encounter opened 10/30/2021. Rich Number, RN ? ?

## 2021-10-30 NOTE — Telephone Encounter (Signed)
Call to client with [place] appt which is scheduled for 11/04/2021 with arrival time of 0845. Appt is at [facility] in [place] and facility name / address provided to client. MHC RV appt scheduled for 11/03/2021 with arrival time of 0800. Jossie Ng, RN ? ?

## 2021-10-30 NOTE — Telephone Encounter (Signed)
Pt needs to know if an appointment has been made for her at the [facility] yet.  They told her that [facility] had not set up the appointment yet and she's trying to get everything set up with them. ?

## 2021-11-03 ENCOUNTER — Ambulatory Visit: Payer: Medicaid Other

## 2021-11-04 ENCOUNTER — Ambulatory Visit: Payer: Medicaid Other | Admitting: *Deleted

## 2021-11-04 ENCOUNTER — Encounter: Payer: Self-pay | Admitting: *Deleted

## 2021-11-04 ENCOUNTER — Telehealth: Payer: Self-pay | Admitting: Family Medicine

## 2021-11-04 ENCOUNTER — Other Ambulatory Visit: Payer: Self-pay

## 2021-11-04 ENCOUNTER — Other Ambulatory Visit: Payer: Self-pay | Admitting: *Deleted

## 2021-11-04 ENCOUNTER — Ambulatory Visit: Payer: Medicaid Other | Attending: Family Medicine

## 2021-11-04 VITALS — BP 109/68 | HR 84

## 2021-11-04 DIAGNOSIS — Z3A21 21 weeks gestation of pregnancy: Secondary | ICD-10-CM | POA: Diagnosis not present

## 2021-11-04 DIAGNOSIS — O0990 Supervision of high risk pregnancy, unspecified, unspecified trimester: Secondary | ICD-10-CM | POA: Insufficient documentation

## 2021-11-04 DIAGNOSIS — Z362 Encounter for other antenatal screening follow-up: Secondary | ICD-10-CM

## 2021-11-04 DIAGNOSIS — Z6835 Body mass index (BMI) 35.0-35.9, adult: Secondary | ICD-10-CM

## 2021-11-04 DIAGNOSIS — O99212 Obesity complicating pregnancy, second trimester: Secondary | ICD-10-CM | POA: Diagnosis not present

## 2021-11-04 DIAGNOSIS — Z363 Encounter for antenatal screening for malformations: Secondary | ICD-10-CM | POA: Diagnosis not present

## 2021-11-04 NOTE — Telephone Encounter (Signed)
Pt rescheduled for 11/12/21 ?

## 2021-11-12 ENCOUNTER — Ambulatory Visit: Payer: Medicaid Other | Admitting: Advanced Practice Midwife

## 2021-11-12 ENCOUNTER — Ambulatory Visit: Payer: Medicaid Other

## 2021-11-12 VITALS — BP 112/66 | Temp 98.0°F | Wt 220.4 lb

## 2021-11-12 DIAGNOSIS — O99212 Obesity complicating pregnancy, second trimester: Secondary | ICD-10-CM

## 2021-11-12 DIAGNOSIS — O09892 Supervision of other high risk pregnancies, second trimester: Secondary | ICD-10-CM

## 2021-11-12 DIAGNOSIS — O99012 Anemia complicating pregnancy, second trimester: Secondary | ICD-10-CM

## 2021-11-12 DIAGNOSIS — O0990 Supervision of high risk pregnancy, unspecified, unspecified trimester: Secondary | ICD-10-CM

## 2021-11-12 DIAGNOSIS — O0992 Supervision of high risk pregnancy, unspecified, second trimester: Secondary | ICD-10-CM

## 2021-11-12 DIAGNOSIS — Z91199 Patient's noncompliance with other medical treatment and regimen due to unspecified reason: Secondary | ICD-10-CM

## 2021-11-12 DIAGNOSIS — O09899 Supervision of other high risk pregnancies, unspecified trimester: Secondary | ICD-10-CM

## 2021-11-12 DIAGNOSIS — O99019 Anemia complicating pregnancy, unspecified trimester: Secondary | ICD-10-CM

## 2021-11-12 LAB — URINALYSIS
Bilirubin, UA: NEGATIVE
Glucose, UA: NEGATIVE
Ketones, UA: NEGATIVE
Leukocytes,UA: NEGATIVE
Nitrite, UA: NEGATIVE
Protein,UA: NEGATIVE
RBC, UA: NEGATIVE
Specific Gravity, UA: 1.02 (ref 1.005–1.030)
Urobilinogen, Ur: 0.2 mg/dL (ref 0.2–1.0)
pH, UA: 7 (ref 5.0–7.5)

## 2021-11-12 MED ORDER — IRON (FERROUS SULFATE) 325 (65 FE) MG PO TABS: 1.0000 | ORAL_TABLET | Freq: Every day | ORAL | 0 refills | Status: AC

## 2021-11-12 NOTE — Addendum Note (Signed)
Addended by: Hal Morales A on: 11/12/2021 05:47 PM ? ? Modules accepted: Orders ? ?

## 2021-11-12 NOTE — Progress Notes (Signed)
Here today for 22.2 week MH RV. Not taking PNV (needs Rx.) Iron given and instructed to take one tablet everyday. Kept 11/04/21 [facility] ultrasound appt. Has follow up US on 12/05/21 and plans to keep. Wants Quad screen today. Tawny Hopping, RN ? ?

## 2021-11-12 NOTE — Progress Notes (Signed)
Iron tablets given and instructed patient to take one tablet everyday but not with PNV. Iron rich foods pamphlet given. Tawny Hopping, RN ? ?

## 2021-11-12 NOTE — Progress Notes (Signed)
[facility] Department ?[facility] ? ?PRENATAL VISIT NOTE ? ?Subjective:  ?Nancy Shaffer is a 26 y.o. G3P2002 at [redacted]w[redacted]d being seen today for ongoing prenatal care.  She is currently monitored for the following issues for this high-risk pregnancy and has Family history of sickle cell disease; Obesity affecting pregnancy BMI=36.0; Supervision of high risk pregnancy, antepartum; Trichomonal vaginitis during pregnancy 08/27/21; Pap smear of cervix unsatisfactory; and Anemia in pregnancy on their problem list. ? ?Patient reports  dizzy .  Contractions: Not present. Vag. Bleeding: None.  Movement: Present. Denies leaking of fluid/ROM.  ? ?The following portions of the patient's history were reviewed and updated as appropriate: allergies, current medications, past family history, past medical history, past social history, past surgical history and problem list. Problem list updated. ? ?Objective:  ? ?Vitals:  ? 11/12/21 1546  ?BP: 112/66  ?Temp: 98 ?F (36.7 ?C)  ?Weight: 220 lb 6.4 oz (100 kg)  ? ? ?Fetal Status: Fetal Heart Rate (bpm): 150 Fundal Height: 25 cm Movement: Present    ? ?General:  Alert, oriented and cooperative. Patient is in no acute distress.  ?Skin: Skin is warm and dry. No rash noted.   ?Cardiovascular: Normal heart rate noted  ?Respiratory: Normal respiratory effort, no problems with respiration noted  ?Abdomen: Soft, gravid, appropriate for gestational age.  Pain/Pressure: Absent     ?Pelvic: Cervical exam deferred        ?Extremities: Normal range of motion.  Edema: None  ?Mental Status: Normal mood and affect. Normal behavior. Normal judgment and thought content.  ? ?Assessment and Plan:  ?Pregnancy: G3P2002 at [redacted]w[redacted]d ? ?1. Supervision of high risk pregnancy, antepartum ?No more N&V;  6 lb wt gain in last 7 wks ?Working 8 hrs/week ?Living with her 2 kids ?1 hour glucola=102 on 1/11/2 ?Wants Quad screen today because Dr. Ernestina Patches told her to do it at next apt ?Reviewed 11/04/21  u/s at 20 2/7 with posterior placenta, 3VC, AFI wnl, normal anatomy ?Has f/u u/s 12/05/21 for incomplete anatomy ?C/o dizziness; hasn't eaten all day (4:15 now)--counseled to eat protein snacks q 2 hours ?Lost vits and hasn't taken since moved early March; rx given ? ?- AFP TETRA ?- Urinalysis (Urine Dip) ?- [id number] Drug Screen ? ?2. Antepartum anemia ?32 oz ice pica/day--counseled not to eat ice ?Taking FeSo4 BID with water she got from grandmother ? ?3. Obesity affecting pregnancy in second trimester ?14 lb 6.4 oz (6.532 kg) ?Not exercising--encouraged to do so regularly ? ? ?4. Noncompliant pregnant patient, antepartum ?No prenatal care x 7 wks ? ? ?- AFP TETRA ?- Urinalysis (Urine Dip) ?- [id number] Drug Screen ? ? ?Preterm labor symptoms and general obstetric precautions including but not limited to vaginal bleeding, contractions, leaking of fluid and fetal movement were reviewed in detail with the patient. ?Please refer to After Visit Summary for other counseling recommendations.  ?Return in about 4 weeks (around 12/10/2021) for routine PNC. ? ?Future Appointments  ?Date Time Provider [place]  ?12/05/2021 10:45 AM [facility] NURSE [facility]  ?12/05/2021 11:00 AM [facility] US1 [facility]  ? ? ?Herbie Saxon, CNM ? ?

## 2021-11-13 LAB — 789231 7+OXYCODONE-BUND
Amphetamines, Urine: NEGATIVE ng/mL
BENZODIAZ UR QL: NEGATIVE ng/mL
Barbiturate screen, urine: NEGATIVE ng/mL
Cannabinoid Quant, Ur: NEGATIVE ng/mL
Cocaine (Metab.): NEGATIVE ng/mL
OPIATE SCREEN URINE: NEGATIVE ng/mL
Oxycodone/Oxymorphone, Urine: NEGATIVE ng/mL
PCP Quant, Ur: NEGATIVE ng/mL

## 2021-11-18 ENCOUNTER — Other Ambulatory Visit: Payer: Self-pay

## 2021-11-18 ENCOUNTER — Encounter: Payer: Self-pay | Admitting: Emergency Medicine

## 2021-11-18 ENCOUNTER — Emergency Department
Admission: EM | Admit: 2021-11-18 | Discharge: 2021-11-18 | Disposition: A | Discharge status: Home / Self Care | Payer: Medicaid Other | Attending: Emergency Medicine | Admitting: Emergency Medicine

## 2021-11-18 DIAGNOSIS — L0291 Cutaneous abscess, unspecified: Secondary | ICD-10-CM

## 2021-11-18 DIAGNOSIS — L02215 Cutaneous abscess of perineum: Secondary | ICD-10-CM | POA: Diagnosis not present

## 2021-11-18 DIAGNOSIS — O99891 Other specified diseases and conditions complicating pregnancy: Secondary | ICD-10-CM | POA: Insufficient documentation

## 2021-11-18 LAB — AFP TETRA
DIA Value (EIA): 94.52 pg/mL
DSR (By Age)    1 IN: 898
Gestational Age: 22.2 WEEKS
MSAFP Mom: 1.22
MSAFP: 83.2 ng/mL
MSHCG: 23660 m[IU]/mL
Maternal Age At EDD: 27.4 yr
Osb Risk: 10000
T18 (By Age): 1:3500 {titer}
Weight: 220 [lb_av]
uE3 Value: 3.07 ng/mL

## 2021-11-18 MED ORDER — CEPHALEXIN 500 MG PO CAPS: 500.0000 mg | ORAL_CAPSULE | Freq: Three times a day (TID) | ORAL | 0 refills | Status: AC

## 2021-11-18 MED ORDER — LIDOCAINE HCL (PF) 1 % IJ SOLN
5.0000 mL | Freq: Once | INTRAMUSCULAR | Status: AC
Start: 2021-11-18 — End: 2021-11-18
  Administered 2021-11-18: 5 mL
  Filled 2021-11-18: qty 5

## 2021-11-18 MED ORDER — LIDOCAINE-PRILOCAINE 2.5-2.5 % EX CREA
TOPICAL_CREAM | Freq: Once | CUTANEOUS | Status: AC
  Administered 2021-11-18: 1 via TOPICAL
  Filled 2021-11-18: qty 5

## 2021-11-18 NOTE — Discharge Instructions (Addendum)
Call to see if your primary care provider can take the packing out of your abscess.  If not return to the emergency department and it can be done here.  Begin taking Keflex 500 mg for the next 7 days.  Use warm moist compresses to the area to help with drainage.  The packing may fall out on its own and in which case she would not have to return to the emergency department.  You may take Tylenol sparingly since you are pregnant but no anti-inflammatories like we discussed. ?

## 2021-11-18 NOTE — ED Notes (Signed)
Attempted to obtain fetal heart tone, under to locate. Pt reports that she currently feels baby moving. No fetal concerns ?

## 2021-11-18 NOTE — ED Provider Notes (Signed)
? ?[facility] ?Provider Note ? ? ? Event Date/Time  ? First MD Initiated Contact with Patient 11/18/21 1132   ?  (approximate) ? ? ?History  ? ?Abscess ? ? ?HPI ? ?Nancy Shaffer is a 26 y.o. female   presents to the ED with an abscess that has started over the last several days.  Patient states it is gotten bigger and has become more painful.  She is also [redacted] weeks pregnant.  No vaginal complaints or discharge.  Patient denies any medical history.  Pain is 7/10. ? ?  ? ? ?Physical Exam  ? ?Triage Vital Signs: ?ED Triage Vitals [11/18/21 1003]  ?Enc Vitals Group  ?   BP 123/82  ?   Pulse Rate 97  ?   Resp 16  ?   Temp 99 ?F (37.2 ?C)  ?   Temp Source Oral  ?   SpO2 98 %  ?   Weight 220 lb (99.8 kg)  ?   Height 5\' 4"  (1.626 m)  ?   Head Circumference   ?   Peak Flow   ?   Pain Score 7  ?   Pain Loc   ?   Pain Edu?   ?   Excl. in GC?   ? ? ?Most recent vital signs: ?Vitals:  ? 11/18/21 1003 11/18/21 1354  ?BP: 123/82 120/82  ?Pulse: 97 80  ?Resp: 16 18  ?Temp: 99 ?F (37.2 ?C)   ?SpO2: 98% 96%  ? ? ? ?General: Awake, no distress.  ?CV:  Good peripheral perfusion.  ?Resp:  Normal effort.  ?Abd:  No distention.  ?Other:  In the area of the mons pubis to the left there is a 1-1/2 diameter abscess that is markedly tender to palpation and fluctuant.  Minimal erythema. ? ? ?ED Results / Procedures / Treatments  ? ?Labs ?(all labs ordered are listed, but only abnormal results are displayed) ?Labs Reviewed - No data to display ? ? ? ? ? ?PROCEDURES: ? ?Critical Care performed:  ? ?01/18/22.Incision and Drainage ? ?Date/Time: 11/18/2021 1:25 PM ?Performed by: 01/18/2022, PA-C ?Authorized by: Tommi Rumps, PA-C  ? ?Consent:  ?  Consent obtained:  Verbal ?  Consent given by:  Patient ?  Risks discussed:  Bleeding, incomplete drainage and infection ?Universal protocol:  ?  Patient identity confirmed:  Verbally with patient ?Location:  ?  Type:  Abscess ?  Location:  Anogenital ?  Anogenital location:   Perineum ?Pre-procedure details:  ?  Skin preparation:  Antiseptic wash ?Anesthesia:  ?  Anesthesia method:  Topical application and local infiltration ?  Topical anesthetic:  EMLA cream ?  Local anesthetic:  Lidocaine 1% w/o epi ?Procedure type:  ?  Complexity:  Simple ?Procedure details:  ?  Needle aspiration: no   ?  Incision types:  Stab incision ?  Incision depth:  Dermal ?  Drainage:  Purulent ?  Drainage amount:  Moderate ?  Wound treatment:  Drain placed ?  Packing materials:  1/4 in iodoform gauze ?Post-procedure details:  ?  Procedure completion:  Tolerated well, no immediate complications ? ? ?MEDICATIONS ORDERED IN ED: ?Medications  ?lidocaine-prilocaine (EMLA) cream (1 application. Topical Given 11/18/21 1245)  ?lidocaine (PF) (XYLOCAINE) 1 % injection 5 mL (5 mLs Infiltration Given 11/18/21 1353)  ? ? ? ?IMPRESSION / MDM / ASSESSMENT AND PLAN / ED COURSE  ?I reviewed the triage vital signs and the nursing notes. ? ? ?Differential diagnosis  includes, but is not limited to, abscess, cellulitis. ? ?26 year old female presents to the ED with abscess in the area of the mons pubis.  Patient is currently [redacted] weeks pregnant.  She is aware that she can take Tylenol sparingly, use warm moist compresses and a prescription for Keflex was sent to the pharmacy.  She is to follow-up with her PCP, urgent care or return to the emergency department if the drain is still there in 2 days.  Patient tolerated I&D extremely well and no complications were incurred. ? ? ? ?  ? ? ?FINAL CLINICAL IMPRESSION(S) / ED DIAGNOSES  ? ?Final diagnoses:  ?Abscess  ? ? ? ?Rx / DC Orders  ? ?ED Discharge Orders   ? ?      Ordered  ?  cephALEXin (KEFLEX) 500 MG capsule  3 times daily       ? 11/18/21 1348  ? ?  ?  ? ?  ? ? ? ?Note:  This document was prepared using Dragon voice recognition software and may include unintentional dictation errors. ?  ?Tommi Rumps, PA-C ?11/18/21 1611 ? ?  ?Sharman Cheek, MD ?11/22/21 1545 ? ?

## 2021-11-18 NOTE — ED Triage Notes (Signed)
Pt to ED via POV stating that she has an abscess on her upper vagina, pt states that it the abscess is right under her stomach area and it has gotten bigger and is painful when walking. Pt reports that she is [redacted] weeks pregnant. Pt is in NAD.  ?

## 2021-11-18 NOTE — ED Notes (Signed)
Pt to Ed ambulatory to room, [redacted] weeks pregnant, FHT auscultated at 156 BPM, bb moving per pt.  ? ?Pt to ED because of ingrown hair that is painful, to L mons pubis area. Pt has not squeezed it. Area is not red, just sightly swollen. Provided juice and crackers to pt. ?

## 2021-12-05 ENCOUNTER — Ambulatory Visit: Payer: Medicaid Other

## 2021-12-05 NOTE — Progress Notes (Signed)
PAP letter mailed today.  January PAP was "Unsatisfactory" and it is now time to repeat it. Hart Carwin, RN ? ?

## 2021-12-10 ENCOUNTER — Ambulatory Visit: Payer: Medicaid Other | Admitting: Advanced Practice Midwife

## 2021-12-10 ENCOUNTER — Encounter: Payer: Self-pay | Admitting: Advanced Practice Midwife

## 2021-12-10 VITALS — BP 114/77 | HR 82 | Temp 97.3°F | Wt 222.8 lb

## 2021-12-10 DIAGNOSIS — O0990 Supervision of high risk pregnancy, unspecified, unspecified trimester: Secondary | ICD-10-CM

## 2021-12-10 DIAGNOSIS — O23599 Infection of other part of genital tract in pregnancy, unspecified trimester: Secondary | ICD-10-CM

## 2021-12-10 DIAGNOSIS — O99019 Anemia complicating pregnancy, unspecified trimester: Secondary | ICD-10-CM

## 2021-12-10 DIAGNOSIS — O23592 Infection of other part of genital tract in pregnancy, second trimester: Secondary | ICD-10-CM

## 2021-12-10 DIAGNOSIS — A5901 Trichomonal vulvovaginitis: Secondary | ICD-10-CM

## 2021-12-10 DIAGNOSIS — R87615 Unsatisfactory cytologic smear of cervix: Secondary | ICD-10-CM

## 2021-12-10 DIAGNOSIS — F5089 Other specified eating disorder: Secondary | ICD-10-CM

## 2021-12-10 DIAGNOSIS — O99012 Anemia complicating pregnancy, second trimester: Secondary | ICD-10-CM

## 2021-12-10 DIAGNOSIS — O99212 Obesity complicating pregnancy, second trimester: Secondary | ICD-10-CM

## 2021-12-10 DIAGNOSIS — O0992 Supervision of high risk pregnancy, unspecified, second trimester: Secondary | ICD-10-CM

## 2021-12-10 HISTORY — DX: Other specified eating disorder: F50.89

## 2021-12-10 LAB — WET PREP FOR TRICH, YEAST, CLUE
Trichomonas Exam: NEGATIVE
Yeast Exam: NEGATIVE

## 2021-12-10 NOTE — Progress Notes (Signed)
Repeat PAP along with Wet Prep today at visit. Provider reviewed wet prep results prior to discharge. Patient reminded of U/S appointment 12/22/2021. Alesia Banda RN ?

## 2021-12-10 NOTE — Progress Notes (Signed)
[facility] Department ?Maternal Health Clinic ? ?PRENATAL VISIT NOTE ? ?Subjective:  ?Nancy Shaffer is a 26 y.o. G3P2002 at [redacted]w[redacted]d being seen today for ongoing prenatal care.  She is currently monitored for the following issues for this high-risk pregnancy and has Family history of sickle cell disease; Obesity affecting pregnancy BMI=36.0; Supervision of high risk pregnancy, antepartum; Trichomonal vaginitis during pregnancy 08/27/21; Pap smear of cervix unsatisfactory; Anemia in pregnancy; Noncompliant pregnant patient; no prenatal care x 7 wks; and Pica ice 32 oz daily on their problem list. ? ?Patient reports backache.   .  .  Movement: Present. Denies leaking of fluid/ROM.  ? ?The following portions of the patient's history were reviewed and updated as appropriate: allergies, current medications, past family history, past medical history, past social history, past surgical history and problem list. Problem list updated. ? ?Objective:  ? ?Vitals:  ? 12/10/21 1528  ?BP: 114/77  ?Pulse: 82  ?Temp: (!) 97.3 ?F (36.3 ?C)  ?Weight: 222 lb 12.8 oz (101.1 kg)  ? ? ?Fetal Status:   Fundal Height: 28 cm Movement: Present    ? ?General:  Alert, oriented and cooperative. Patient is in no acute distress.  ?Skin: Skin is warm and dry. No rash noted.   ?Cardiovascular: Normal heart rate noted  ?Respiratory: Normal respiratory effort, no problems with respiration noted  ?Abdomen: Soft, gravid, appropriate for gestational age.        ?Pelvic: Cervical exam deferred        ?Extremities: Normal range of motion.  Edema: None  ?Mental Status: Normal mood and affect. Normal behavior. Normal judgment and thought content.  ? ?Assessment and Plan:  ?Pregnancy: [id number] at 103w2d ? ?1. Trichomonal vaginitis during pregnancy, antepartum ?Wet mount done for 3 mo TOC ?- WET PREP FOR TRICH, [place], CLUE ? ?2. Pap smear of cervix unsatisfactory ?Repeat pap today ?- IGP, rfx Aptima HPV ASCU ? ?3. Antepartum anemia ?Taking FeSo4  1-2 x/day with water ?4. Obesity affecting pregnancy in second trimester ?16 lb 12.8 oz (7.62 kg) ?Initiated care too late to begin ? ?5. Supervision of high risk pregnancy, antepartum ?Working 8 hours/wk ?10/28/21 u/s cancelled due to not accepting her medicaid ?11/04/21 u/s reviewed at 20 2/7 with 3VC, posterior, AFI wnl, normal anatomy ?Has f/u anatomy u/s 12/22/21 ?- IGP, rfx Aptima HPV ASCU ? ?6. Pica ice 32 oz daily ?States not eating anymore ? ? ?Preterm labor symptoms and general obstetric precautions including but not limited to vaginal bleeding, contractions, leaking of fluid and fetal movement were reviewed in detail with the patient. ?Please refer to After Visit Summary for other counseling recommendations.  ?Return in about 2 weeks (around 12/24/2021) for 28 week labs, routine PNC. ? ?Future Appointments  ?Date Time Provider [place]  ?12/22/2021 10:30 AM [facility] NURSE [facility]  ?12/22/2021 10:45 AM [facility] US6 [facility]  ? ? ?Herbie Saxon, CNM ? ?

## 2021-12-16 LAB — IGP, RFX APTIMA HPV ASCU: PAP Smear Comment: 0

## 2021-12-22 ENCOUNTER — Ambulatory Visit: Payer: Medicaid Other | Attending: Maternal & Fetal Medicine

## 2021-12-22 ENCOUNTER — Ambulatory Visit: Payer: Medicaid Other

## 2022-01-01 ENCOUNTER — Ambulatory Visit: Payer: Medicaid Other | Attending: Maternal & Fetal Medicine | Admitting: *Deleted

## 2022-01-01 ENCOUNTER — Ambulatory Visit (HOSPITAL_BASED_OUTPATIENT_CLINIC_OR_DEPARTMENT_OTHER): Payer: Medicaid Other

## 2022-01-01 VITALS — BP 106/60 | HR 79

## 2022-01-01 DIAGNOSIS — Z362 Encounter for other antenatal screening follow-up: Secondary | ICD-10-CM | POA: Diagnosis not present

## 2022-01-01 DIAGNOSIS — Z3A29 29 weeks gestation of pregnancy: Secondary | ICD-10-CM

## 2022-01-01 DIAGNOSIS — O99213 Obesity complicating pregnancy, third trimester: Secondary | ICD-10-CM

## 2022-01-01 DIAGNOSIS — E669 Obesity, unspecified: Secondary | ICD-10-CM | POA: Diagnosis not present

## 2022-01-01 DIAGNOSIS — O99019 Anemia complicating pregnancy, unspecified trimester: Secondary | ICD-10-CM

## 2022-01-01 DIAGNOSIS — Z6835 Body mass index (BMI) 35.0-35.9, adult: Secondary | ICD-10-CM

## 2022-01-01 DIAGNOSIS — O0990 Supervision of high risk pregnancy, unspecified, unspecified trimester: Secondary | ICD-10-CM

## 2022-01-02 ENCOUNTER — Other Ambulatory Visit: Payer: Self-pay | Admitting: *Deleted

## 2022-01-02 DIAGNOSIS — O99213 Obesity complicating pregnancy, third trimester: Secondary | ICD-10-CM

## 2022-01-07 ENCOUNTER — Encounter: Payer: Self-pay | Admitting: Nurse Practitioner

## 2022-01-07 ENCOUNTER — Ambulatory Visit: Payer: Medicaid Other | Admitting: Nurse Practitioner

## 2022-01-07 VITALS — BP 104/69 | HR 105 | Temp 97.9°F | Wt 221.8 lb

## 2022-01-07 DIAGNOSIS — O99013 Anemia complicating pregnancy, third trimester: Secondary | ICD-10-CM

## 2022-01-07 DIAGNOSIS — O99213 Obesity complicating pregnancy, third trimester: Secondary | ICD-10-CM

## 2022-01-07 DIAGNOSIS — O0993 Supervision of high risk pregnancy, unspecified, third trimester: Secondary | ICD-10-CM

## 2022-01-07 DIAGNOSIS — O99210 Obesity complicating pregnancy, unspecified trimester: Secondary | ICD-10-CM

## 2022-01-07 DIAGNOSIS — O99019 Anemia complicating pregnancy, unspecified trimester: Secondary | ICD-10-CM

## 2022-01-07 LAB — HEMOGLOBIN, FINGERSTICK: Hemoglobin: 9.7 g/dL — ABNORMAL LOW (ref 11.1–15.9)

## 2022-01-07 NOTE — Progress Notes (Signed)
[place] [facility]  PRENATAL VISIT NOTE  Subjective:  Nancy Shaffer is a 26 y.o. G3P2002 at [redacted]w[redacted]d being seen today for ongoing prenatal care.  She is currently monitored for the following issues for this high-risk pregnancy and has Family history of sickle cell disease; Obesity affecting pregnancy BMI=36.0; Supervision of high risk pregnancy, antepartum; Trichomonal vaginitis during pregnancy 08/27/21; Pap smear of cervix unsatisfactory; Anemia in pregnancy; Noncompliant pregnant patient; no prenatal care x 7 wks; and Pica ice 32 oz daily on their problem list.  Patient reports backache.  Contractions: Not present. Vag. Bleeding: None.  Movement: Present. Denies leaking of fluid/ROM.   The following portions of the patient's history were reviewed and updated as appropriate: allergies, current medications, past family history, past medical history, past social history, past surgical history and problem list. Problem list updated.  Objective:   Vitals:   01/07/22 1319  BP: 104/69  Pulse: (!) 105  Temp: 97.9 F (36.6 C)  Weight: 221 lb 12.8 oz (100.6 kg)    Fetal Status: Fetal Heart Rate (bpm): 155 Fundal Height: 31 cm Movement: Present     General:  Alert, oriented and cooperative. Patient is in no acute distress.  Skin: Skin is warm and dry. No rash noted.   Cardiovascular: Normal heart rate noted  Respiratory: Normal respiratory effort, no problems with respiration noted  Abdomen: Soft, gravid, appropriate for gestational age.  Pain/Pressure: Present     Pelvic: Cervical exam deferred        Extremities: Normal range of motion.  Edema: None  Mental Status: Normal mood and affect. Normal behavior. Normal judgment and thought content.   Assessment and Plan:  Pregnancy: G3P2002 at [redacted]w[redacted]d  1. Supervision of high risk pregnancy in third trimester -26 year old female in clinic today for prenatal care. -Patient taking PNV daily. -Patient has  complaints of back pain.  Advised patient to provide extra support pillows when in bed and to also look in to purchasing a maternity support belt. -Reviewed 01/01/22 (29w 3d) U/S = EDD 03/16/22 placenta posterior, AFI normal, EFW 11%.   2. Antepartum anemia -Patient states she has been taking her Iron separate from PNV with water twice a day. -Hgb today = 9.7.  Advised patient to take Iron BID QOD, and encouraged to take with juice.  Referral also submitted to Hematology.  Will assess Iron level in 4 weeks.    - Fe+CBC/D/Plt+TIBC+Fer+Retic - Hemoglobin, venipuncture - Ambulatory referral to Hematology / Oncology   3. Obesity affecting pregnancy, antepartum -Patient states she walks 15 minutes a day. Advised patient to increase exercise to at least 30 minutes a day.  Also advised patient to limit sugar, fats, and carbs and drink at least 6-8 glasses of water.  -15 lb 12.8 oz (7.167 kg)    Term labor symptoms and general obstetric precautions including but not limited to vaginal bleeding, contractions, leaking of fluid and fetal movement were reviewed in detail with the patient. Please refer to After Visit Summary for other counseling recommendations.   Return in about 2 weeks (around 01/21/2022) for Routine prenatal care visit.  Future Appointments  Date Time Provider [place]  01/21/2022 10:20 AM [facility] PROVIDER [facility] None  01/30/2022  3:15 PM [facility] NURSE [facility]  01/30/2022  3:30 PM [facility] US2 [facility] [place]    Gregary Cromer, FNP

## 2022-01-07 NOTE — Progress Notes (Signed)
Aware of 01/30/2022 [facility] [place] appt. Reports taking iron BID with water. Client states not taking with juice as not comfortable taking medication with any thing but water. Provider notified of above. Taking PNV daily. Jossie Ng, RN Hgb = 9.7 and Ayo White FNP=-BC aware. Jossie Ng, RN Per verbal order of Glenna Fellows FNP-BC, client is to begin taking Ferrous Sulfate 325mg  po BID every other day and client so counseled. , RN

## 2022-01-08 LAB — FE+CBC/D/PLT+TIBC+FER+RETIC
Basophils Absolute: 0.1 10*3/uL (ref 0.0–0.2)
Basos: 0 %
EOS (ABSOLUTE): 0 10*3/uL (ref 0.0–0.4)
Eos: 0 %
Ferritin: 9 ng/mL — ABNORMAL LOW (ref 15–150)
Hematocrit: 30.8 % — ABNORMAL LOW (ref 34.0–46.6)
Hemoglobin: 10 g/dL — ABNORMAL LOW (ref 11.1–15.9)
Immature Grans (Abs): 0.1 10*3/uL (ref 0.0–0.1)
Immature Granulocytes: 1 %
Iron Saturation: 5 % — CL (ref 15–55)
Iron: 20 ug/dL — ABNORMAL LOW (ref 27–159)
Lymphocytes Absolute: 2.2 10*3/uL (ref 0.7–3.1)
Lymphs: 19 %
MCH: 26.1 pg — ABNORMAL LOW (ref 26.6–33.0)
MCHC: 32.5 g/dL (ref 31.5–35.7)
MCV: 80 fL (ref 79–97)
Monocytes Absolute: 0.6 10*3/uL (ref 0.1–0.9)
Monocytes: 5 %
Neutrophils Absolute: 8.5 10*3/uL — ABNORMAL HIGH (ref 1.4–7.0)
Neutrophils: 75 %
Platelets: 388 10*3/uL (ref 150–450)
RBC: 3.83 x10E6/uL (ref 3.77–5.28)
RDW: 13.8 % (ref 11.7–15.4)
Retic Ct Pct: 2.1 % (ref 0.6–2.6)
Total Iron Binding Capacity: 400 ug/dL (ref 250–450)
UIBC: 380 ug/dL (ref 131–425)
WBC: 11.4 10*3/uL — ABNORMAL HIGH (ref 3.4–10.8)

## 2022-01-19 ENCOUNTER — Inpatient Hospital Stay: Payer: Medicaid Other | Attending: Oncology | Admitting: Oncology

## 2022-01-19 ENCOUNTER — Inpatient Hospital Stay: Payer: Medicaid Other

## 2022-01-19 ENCOUNTER — Encounter: Payer: Self-pay | Admitting: Oncology

## 2022-01-19 DIAGNOSIS — D509 Iron deficiency anemia, unspecified: Secondary | ICD-10-CM | POA: Insufficient documentation

## 2022-01-19 DIAGNOSIS — O99013 Anemia complicating pregnancy, third trimester: Secondary | ICD-10-CM | POA: Insufficient documentation

## 2022-01-21 ENCOUNTER — Ambulatory Visit: Payer: Medicaid Other | Admitting: Family Medicine

## 2022-01-21 ENCOUNTER — Telehealth: Payer: Self-pay

## 2022-01-21 VITALS — BP 106/66 | HR 88 | Temp 97.2°F | Wt 224.4 lb

## 2022-01-21 DIAGNOSIS — O0993 Supervision of high risk pregnancy, unspecified, third trimester: Secondary | ICD-10-CM

## 2022-01-21 DIAGNOSIS — O99019 Anemia complicating pregnancy, unspecified trimester: Secondary | ICD-10-CM

## 2022-01-21 DIAGNOSIS — O99210 Obesity complicating pregnancy, unspecified trimester: Secondary | ICD-10-CM

## 2022-01-21 NOTE — Progress Notes (Signed)
[facility] 01/19/2022 at [facility] for Hematology appt with labs. Cancer Center phone number given to client this am to call for appt. Aware of 01/30/2022 [facility] [place] appt in [place]. Per client, taking iron TID every day with orange juice. Taking PNV QD 2 - 3 hours after takes iron tablet. Jossie Ng, RN

## 2022-01-21 NOTE — Telephone Encounter (Signed)
Call to client and counseled RN failed to provide 28 week labs today and requested she return ASAP to have done. Per client, she can come tomorrow at 1000 and scheduled in 0900 [facility] OB Problem appt slot. 2 week follow-up appt not scheduled at end of visit today. [facility] RV appt scheduled for 02/04/2022. Rich Number, RN

## 2022-01-22 NOTE — Progress Notes (Signed)
[facility]  PRENATAL VISIT NOTE  Subjective:  Nancy Shaffer is a 26 y.o. G3P2002 at [redacted]w[redacted]d being seen today for ongoing prenatal care.  She is currently monitored for the following issues for this high-risk pregnancy and has Family history of sickle cell disease; Obesity affecting pregnancy BMI=36.0; Supervision of high risk pregnancy, antepartum; Trichomonal vaginitis during pregnancy 08/27/21; Pap smear of cervix unsatisfactory; Anemia in pregnancy; Noncompliant pregnant patient; no prenatal care x 7 wks; and Pica ice 32 oz daily on their problem list.  Patient reports backache.  Contractions: Irritability. Vag. Bleeding: None.  Movement: Present. Denies leaking of fluid/ROM.   The following portions of the patient's history were reviewed and updated as appropriate: allergies, current medications, past family history, past medical history, past social history, past surgical history and problem list. Problem list updated.  Objective:   Vitals:   01/21/22 1041  BP: 106/66  Pulse: 88  Temp: (!) 97.2 F (36.2 C)  Weight: 224 lb 6.4 oz (101.8 kg)    Fetal Status: Fetal Heart Rate (bpm): 153 Fundal Height: 33 cm Movement: Present     General:  Alert, oriented and cooperative. Patient is in no acute distress.  Skin: Skin is warm and dry. No rash noted.   Cardiovascular: Normal heart rate noted  Respiratory: Normal respiratory effort, no problems with respiration noted  Abdomen: Soft, gravid, appropriate for gestational age.  Pain/Pressure: Present     Pelvic: Cervical exam deferred        Extremities: Normal range of motion.  Edema: Trace  Mental Status: Normal mood and affect. Normal behavior. Normal judgment and thought content.   Assessment and Plan:  Pregnancy: G3P2002 at [redacted]w[redacted]d  1. Supervision of high risk pregnancy in third trimester TWG 18 lbs  Taking PNV as directed  Reports back pain.  Inquired if tried any of the  recommendations at last visit.  Pt reports trying floor stretching 2-3 times, but she did not want to try belt.   Encouraged to continue with stretching exercises.     2. Antepartum anemia Discussed with patient the importance of needing heme referral.  Last Hbg was 9.7  Denies eating ice.  Pt was given number to reschedule missed appointment.    3. Obesity affecting pregnancy, antepartum Discussed walking, pt reports she walks at work.  Discussed walking for exercise vs casual walking.    Preterm labor symptoms and general obstetric precautions including but not limited to vaginal bleeding, contractions, leaking of fluid and fetal movement were reviewed in detail with the patient. Please refer to After Visit Summary for other counseling recommendations.  Return in about 2 weeks (around 02/04/2022) for routine prenatal care.  Future Appointments  Date Time Provider [place]  01/23/2022  2:20 PM [facility] PROVIDER [facility] None  01/27/2022 10:45 AM Earlie Server, MD [facility] None  01/27/2022 11:45 AM [facility] LAB [facility] None  01/30/2022  3:15 PM [facility] NURSE [facility]  01/30/2022  3:30 PM [facility] US2 [facility]  02/04/2022 10:40 AM [facility] PROVIDER [facility]MAT None    Junious Dresser, FNP

## 2022-01-23 ENCOUNTER — Ambulatory Visit: Payer: Medicaid Other | Admitting: Nurse Practitioner

## 2022-01-23 DIAGNOSIS — Z23 Encounter for immunization: Secondary | ICD-10-CM | POA: Diagnosis not present

## 2022-01-23 DIAGNOSIS — O0993 Supervision of high risk pregnancy, unspecified, third trimester: Secondary | ICD-10-CM

## 2022-01-23 LAB — HEMOGLOBIN, FINGERSTICK: Hemoglobin: 11.6 g/dL (ref 11.1–15.9)

## 2022-01-23 NOTE — Progress Notes (Signed)
Presents today to [facility] for 28 week labs only. Counseled regarding recommendation for Tdap in pregnancy and for those who will spend any amt of time with infant. Client tolerated Tdap injection today without difficulty. Aware of [facility]) appt 01/27/2022 and [facility] [place] appt on 01/30/22 [facility]). Client aware of [facility] RV appt on 02/04/2022. Jossie Ng, RN Hgb = 11.6. Per verbal order of Glenna Fellows FNP-BC, decrease iron to BID QOD. Client counseled regarding above and understanding  verbalized. Jossie Ng, RN

## 2022-01-24 LAB — RPR: RPR Ser Ql: NONREACTIVE

## 2022-01-24 LAB — HIV ANTIBODY (ROUTINE TESTING W REFLEX): HIV Screen 4th Generation wRfx: NONREACTIVE

## 2022-01-24 LAB — GLUCOSE, 1 HOUR GESTATIONAL: Gestational Diabetes Screen: 108 mg/dL (ref 70–139)

## 2022-01-27 ENCOUNTER — Other Ambulatory Visit: Payer: Self-pay

## 2022-01-27 ENCOUNTER — Inpatient Hospital Stay: Payer: Medicaid Other

## 2022-01-27 ENCOUNTER — Inpatient Hospital Stay (HOSPITAL_BASED_OUTPATIENT_CLINIC_OR_DEPARTMENT_OTHER): Payer: Medicaid Other | Admitting: Oncology

## 2022-01-27 ENCOUNTER — Encounter: Payer: Self-pay | Admitting: Oncology

## 2022-01-27 VITALS — BP 113/78 | HR 97 | Temp 97.1°F | Wt 221.0 lb

## 2022-01-27 DIAGNOSIS — D508 Other iron deficiency anemias: Secondary | ICD-10-CM

## 2022-01-27 DIAGNOSIS — O99013 Anemia complicating pregnancy, third trimester: Secondary | ICD-10-CM | POA: Diagnosis present

## 2022-01-27 DIAGNOSIS — D509 Iron deficiency anemia, unspecified: Secondary | ICD-10-CM

## 2022-01-27 DIAGNOSIS — D649 Anemia, unspecified: Secondary | ICD-10-CM | POA: Insufficient documentation

## 2022-01-27 LAB — CBC WITH DIFFERENTIAL/PLATELET
Abs Immature Granulocytes: 0.2 10*3/uL — ABNORMAL HIGH (ref 0.00–0.07)
Basophils Absolute: 0.1 10*3/uL (ref 0.0–0.1)
Basophils Relative: 1 %
Eosinophils Absolute: 0 10*3/uL (ref 0.0–0.5)
Eosinophils Relative: 0 %
HCT: 34.3 % — ABNORMAL LOW (ref 36.0–46.0)
Hemoglobin: 10.7 g/dL — ABNORMAL LOW (ref 12.0–15.0)
Immature Granulocytes: 2 %
Lymphocytes Relative: 19 %
Lymphs Abs: 1.8 10*3/uL (ref 0.7–4.0)
MCH: 26.2 pg (ref 26.0–34.0)
MCHC: 31.2 g/dL (ref 30.0–36.0)
MCV: 83.9 fL (ref 80.0–100.0)
Monocytes Absolute: 0.6 10*3/uL (ref 0.1–1.0)
Monocytes Relative: 7 %
Neutro Abs: 6.8 10*3/uL (ref 1.7–7.7)
Neutrophils Relative %: 71 %
Platelets: 332 10*3/uL (ref 150–400)
RBC: 4.09 MIL/uL (ref 3.87–5.11)
RDW: 16.3 % — ABNORMAL HIGH (ref 11.5–15.5)
WBC: 9.5 10*3/uL (ref 4.0–10.5)
nRBC: 0 % (ref 0.0–0.2)

## 2022-01-27 LAB — IRON AND TIBC
Iron: 29 ug/dL (ref 28–170)
Saturation Ratios: 6 % — ABNORMAL LOW (ref 10.4–31.8)
TIBC: 491 ug/dL — ABNORMAL HIGH (ref 250–450)
UIBC: 462 ug/dL

## 2022-01-27 LAB — RETIC PANEL
Immature Retic Fract: 18.2 % — ABNORMAL HIGH (ref 2.3–15.9)
RBC.: 4.07 MIL/uL (ref 3.87–5.11)
Retic Count, Absolute: 105.8 10*3/uL (ref 19.0–186.0)
Retic Ct Pct: 2.6 % (ref 0.4–3.1)
Reticulocyte Hemoglobin: 31.2 pg (ref >27.9)

## 2022-01-27 LAB — FERRITIN: Ferritin: 13 ng/mL (ref 11–307)

## 2022-01-27 LAB — TECHNOLOGIST SMEAR REVIEW: Plt Morphology: ADEQUATE

## 2022-01-27 NOTE — Assessment & Plan Note (Signed)
Check CBC, smear, iron, TIBC ferritin. Hemoglobin 10.7, iron saturation 6, ferritin 13, slightly improved comparing to 1 month ago. Discussed about option of oral iron supplementation versus IV Venofer treatments.   Given that her hemoglobin has improved after few weeks of oral iron supplementation, I recommend patient to continue ferrous sulfate 325 mg twice daily, she may drink orange juice juice or take over-the-counter vitamin C supplementation. Plan to repeat CBC, iron panel in mid July 2023, if persistently decreased, consider IV Venofer treatment at that time.

## 2022-01-27 NOTE — Assessment & Plan Note (Signed)
Combination of gestational anemia as well as iron deficiency anemia. Continue oral iron supplementation.

## 2022-01-27 NOTE — Progress Notes (Signed)
Hematology/Oncology Consult note [phone] [phone]      Patient Care Team: Center, [facility] as PCP - General  ASSESSMENT & PLAN:  IDA (iron deficiency anemia) Check CBC, smear, iron, TIBC ferritin. Hemoglobin 10.7, iron saturation 6, ferritin 13, slightly improved comparing to 1 month ago. Discussed about option of oral iron supplementation versus IV Venofer treatments.   Given that her hemoglobin has improved after few weeks of oral iron supplementation, I recommend patient to continue ferrous sulfate 325 mg twice daily, she may drink orange juice juice or take over-the-counter vitamin C supplementation. Plan to repeat CBC, iron panel in mid July 2023, if persistently decreased, consider IV Venofer treatment at that time.  Anemia in pregnancy Combination of gestational anemia as well as iron deficiency anemia. Continue oral iron supplementation.  Orders Placed This Encounter  Procedures   Ferritin    Standing Status:   Future    Number of Occurrences:   1    Standing Expiration Date:   07/29/2022   Technologist smear review    Standing Status:   Future    Number of Occurrences:   1    Standing Expiration Date:   01/28/2023   CBC with Differential/Platelet    Standing Status:   Future    Number of Occurrences:   1    Standing Expiration Date:   01/28/2023   Retic Panel    Standing Status:   Future    Number of Occurrences:   1    Standing Expiration Date:   01/28/2023   Iron and TIBC    Standing Status:   Future    Number of Occurrences:   1    Standing Expiration Date:   01/28/2023   Labs being 4 -5 weeks  follow-up in 3 months. All questions were answered. The patient knows to call the clinic with any problems, questions or concerns.  Rickard Patience, MD, PhD [facility] Oncology 01/27/2022      REFERRING PROVIDER: Glenna Fellows, FNP  CHIEF COMPLAINTS/REASON FOR VISIT:  Anemia  HISTORY OF PRESENTING ILLNESS:   Nancy Shaffer is a  26 y.o.  female with PMH listed below who was referred to me for anemia Reviewed patient's recent labs that was done.  She was found to have abnormal CBC on 01/07/2022.  Hemoglobin 10, MCV 80, iron saturation 5, ferritin 9. Patient is currently pregnant, in third trimester.  She denies recent chest pain on exertion, shortness of breath on minimal exertion, pre-syncopal episodes, or palpitations She had not noticed any recent bleeding such as epistaxis, hematuria or hematochezia.  She denies any pica and eats a variety of diet. The patient was prescribed oral iron supplements and she previously took ferrous sulfate 325 mg 3 times daily and recently been told to take once daily.   MEDICAL HISTORY:  Past Medical History:  Diagnosis Date   Anemia    Headache    Pica ice 32 oz daily 12/10/2021    SURGICAL HISTORY: Past Surgical History:  Procedure Laterality Date   NO PAST SURGERIES      SOCIAL HISTORY: Social History   Socioeconomic History   Marital status: Single    Spouse name: Not on file   Number of children: 2   Years of education: 59   Highest education level: 12th grade  Occupational History   Occupation: [employer]   Tobacco Use   Smoking status: Never    Passive exposure: Never   Smokeless tobacco: Never  Vaping Use   Vaping Use: Never used  Substance and Sexual Activity   Alcohol use: Not Currently    Alcohol/week: 2.0 standard drinks of alcohol    Types: 2 Glasses of wine per week    Comment: last use - one month ago, approx 07/17/2021   Drug use: Not Currently   Sexual activity: Yes    Partners: Male    Birth control/protection: Condom, None    Comment: hx ocp approx 4 yrs ago- no prob  Other Topics Concern   Not on file  Social History Narrative   Lives with 72 girls, 27 year old daughter and 27 year old daughter. Patient has good support system with friends and family. FOB no present in pregnancy.     Social Determinants  of Health   Financial Resource Strain: Low Risk  (08/27/2021)   Overall Financial Resource Strain (CARDIA)    Difficulty of Paying Living Expenses: Not hard at all  Food Insecurity: No Food Insecurity (08/27/2021)   Hunger Vital Sign    Worried About Running Out of Food in the Last Year: Never true    Ran Out of Food in the Last Year: Never true  Transportation Needs: No Transportation Needs (08/27/2021)   PRAPARE - Administrator, Civil ServiceTransportation    Lack of Transportation (Medical): No    Lack of Transportation (Non-Medical): No  Physical Activity: Inactive (09/05/2018)   Exercise Vital Sign    Days of Exercise per Week: 0 days    Minutes of Exercise per Session: 0 min  Stress: Not on file  Social Connections: Unknown (09/05/2018)   Social Connection and Isolation Panel [NHANES]    Frequency of Communication with Friends and Family: More than three times a week    Frequency of Social Gatherings with Friends and Family: More than three times a week    Attends Religious Services: Never    Database administratorActive Member of Clubs or Organizations: No    Attends BankerClub or Organization Meetings: Never    Marital Status: Not on file  Intimate Partner Violence: Not At Risk (08/27/2021)   Humiliation, Afraid, Rape, and Kick questionnaire    Fear of Current or Ex-Partner: No    Emotionally Abused: No    Physically Abused: No    Sexually Abused: No    FAMILY HISTORY: Family History  Problem Relation Age of Onset   Healthy Mother    Sickle cell trait Father    Healthy Sister    Sickle cell anemia Brother    Healthy Daughter    Diabetes Maternal Grandfather    Arthritis Paternal Grandmother    Breast cancer Neg Hx     ALLERGIES:  has No Known Allergies.  MEDICATIONS:  Current Outpatient Medications  Medication Sig Dispense Refill   Iron, Ferrous Sulfate, 325 (65 Fe) MG TABS Take 1 tablet by mouth daily at 6 (six) AM. 100 tablet 0   Prenatal Vit-Fe Fumarate-FA (PRENATAL MULTIVITAMIN) TABS tablet Take 1 tablet by mouth  daily at 12 noon.     No current facility-administered medications for this visit.    Review of Systems  Constitutional:  Negative for appetite change, chills, fatigue and fever.  HENT:   Negative for hearing loss and voice change.   Eyes:  Negative for eye problems.  Respiratory:  Negative for chest tightness and cough.   Cardiovascular:  Negative for chest pain.  Gastrointestinal:  Negative for abdominal distention, abdominal pain and blood in stool.  Endocrine: Negative for hot flashes.  Genitourinary:  Negative  for difficulty urinating and frequency.   Musculoskeletal:  Negative for arthralgias.  Skin:  Negative for itching and rash.  Neurological:  Negative for extremity weakness.  Hematological:  Negative for adenopathy.  Psychiatric/Behavioral:  Negative for confusion.     PHYSICAL EXAMINATION: ECOG PERFORMANCE STATUS: 0 - Asymptomatic Vitals:   01/27/22 1053  BP: 113/78  Pulse: 97  Temp: (!) 97.1 F (36.2 C)   Filed Weights   01/27/22 1053  Weight: 221 lb (100.2 kg)    Physical Exam Constitutional:      General: She is not in acute distress. HENT:     Head: Normocephalic and atraumatic.  Eyes:     General: No scleral icterus. Cardiovascular:     Rate and Rhythm: Normal rate and regular rhythm.     Heart sounds: Normal heart sounds.  Pulmonary:     Effort: Pulmonary effort is normal. No respiratory distress.     Breath sounds: No wheezing.  Abdominal:     Comments: Gravid uterus  Musculoskeletal:        General: No deformity. Normal range of motion.     Cervical back: Normal range of motion and neck supple.  Skin:    General: Skin is warm and dry.     Findings: No erythema or rash.  Neurological:     Mental Status: She is alert and oriented to person, place, and time. Mental status is at baseline.  Psychiatric:        Mood and Affect: Mood normal.      LABORATORY DATA:  I have reviewed the data as listed Lab Results  Component Value Date    WBC 9.5 01/27/2022   HGB 10.7 (L) 01/27/2022   HCT 34.3 (L) 01/27/2022   MCV 83.9 01/27/2022   PLT 332 01/27/2022   Lab Results  Component Value Date   NA 135 08/27/2021   K 3.6 08/27/2021   CL 102 08/27/2021   CO2 21 08/27/2021      Component Value Date/Time   IRON 29 01/27/2022 1125   IRON 20 (L) 01/07/2022 1350   TIBC 491 (H) 01/27/2022 1125   TIBC 400 01/07/2022 1350   FERRITIN 13 01/27/2022 1125   FERRITIN 9 (L) 01/07/2022 1350   IRONPCTSAT 6 (L) 01/27/2022 1125   IRONPCTSAT 5 (LL) 01/07/2022 1350     RADIOGRAPHIC STUDIES: I have personally reviewed the radiological images as listed and agreed with the findings in the report.

## 2022-01-28 ENCOUNTER — Telehealth: Payer: Self-pay

## 2022-01-28 NOTE — Telephone Encounter (Signed)
-----   Message from Rickard Patience, MD sent at 01/27/2022  7:28 PM EDT ----- Please let patient know that her hemoglobin is at 10.7, iron level has slightly improved comparing to 2 weeks ago.  I recommend patient to continue oral ferrous sulfate 325 mg twice daily.  She may drink orange juice or take over-the-counter vitamin C supplementation to increase absorption. Please arrange patient to have a lab encounter in 4 weeks, also arrange her to follow-up in 3 months, with labs prior to MD +/- Venofer treatments.  All labs are ordered.  Thank you

## 2022-01-28 NOTE — Telephone Encounter (Signed)
TC from patient stating she had some bleeding. Patient states she had some light pink and few spots red blood on toilet tissue about 10 minutes ago. Patient denies cramping, fluid gush, states baby is moving as normal. Last sex 3 days ago. No burning with urination, no urgency. Patient states she has not had to change underwear since that blood 10 minutes ago and does not see anymore blood. Patient counseled to go to ED if she feels a gush of fluid, bleeding increases or baby stops moving as normal. Per consult with E. Sciora, patient counseled no sex for 1 week after last spotting. Patient has [place] RV on 02/04/22 and patient counseled to keep appointment and to call before then if she feels like she has UTI symptoms or further concerns. Patient states understanding.Jenetta Downer, RN

## 2022-01-28 NOTE — Telephone Encounter (Signed)
Called pt to inform of appts scheduled, unable to LVM reminder to be sent

## 2022-01-28 NOTE — Telephone Encounter (Signed)
[place] please schedule patient for  Lab encounter in 4 weeks: MD in 3 months with labs prior to visit.  Please notify patient of appt.

## 2022-01-28 NOTE — Telephone Encounter (Signed)
Consulted on the plan of care for this client.  I agree with the documented note and actions taken to provide care for this client.  E. Kinnley Paulson, CNM  

## 2022-01-28 NOTE — Telephone Encounter (Signed)
Called to inform patient of results and Dr. Bethanne Ginger recommendation. No answer, was unable to leave message. MyChart message sent as well.

## 2022-01-30 ENCOUNTER — Encounter: Payer: Self-pay | Admitting: *Deleted

## 2022-01-30 ENCOUNTER — Ambulatory Visit: Payer: Medicaid Other | Attending: Obstetrics

## 2022-01-30 ENCOUNTER — Ambulatory Visit: Payer: Medicaid Other | Admitting: *Deleted

## 2022-01-30 VITALS — BP 121/72 | HR 107

## 2022-01-30 DIAGNOSIS — O99213 Obesity complicating pregnancy, third trimester: Secondary | ICD-10-CM | POA: Insufficient documentation

## 2022-01-30 DIAGNOSIS — O23599 Infection of other part of genital tract in pregnancy, unspecified trimester: Secondary | ICD-10-CM | POA: Diagnosis present

## 2022-01-30 DIAGNOSIS — E669 Obesity, unspecified: Secondary | ICD-10-CM

## 2022-01-30 DIAGNOSIS — A5901 Trichomonal vulvovaginitis: Secondary | ICD-10-CM

## 2022-01-30 DIAGNOSIS — O0990 Supervision of high risk pregnancy, unspecified, unspecified trimester: Secondary | ICD-10-CM | POA: Diagnosis present

## 2022-01-30 DIAGNOSIS — Z3A33 33 weeks gestation of pregnancy: Secondary | ICD-10-CM

## 2022-02-04 ENCOUNTER — Ambulatory Visit: Payer: Medicaid Other | Admitting: Advanced Practice Midwife

## 2022-02-04 VITALS — BP 113/76 | HR 87 | Temp 96.8°F | Wt 221.4 lb

## 2022-02-04 DIAGNOSIS — Z91199 Patient's noncompliance with other medical treatment and regimen due to unspecified reason: Secondary | ICD-10-CM

## 2022-02-04 DIAGNOSIS — O0990 Supervision of high risk pregnancy, unspecified, unspecified trimester: Secondary | ICD-10-CM

## 2022-02-04 DIAGNOSIS — O99013 Anemia complicating pregnancy, third trimester: Secondary | ICD-10-CM

## 2022-02-04 DIAGNOSIS — O09899 Supervision of other high risk pregnancies, unspecified trimester: Secondary | ICD-10-CM

## 2022-02-04 DIAGNOSIS — F5089 Other specified eating disorder: Secondary | ICD-10-CM

## 2022-02-04 DIAGNOSIS — O99210 Obesity complicating pregnancy, unspecified trimester: Secondary | ICD-10-CM

## 2022-02-04 NOTE — Progress Notes (Signed)
Kept 01/27/2022 appt at [facility]. Aware of 02/25/2022 Hematology lab appt. Correctly verbalizes to take PNV and iron tablet at least one hour apart. Taking iron tablet with orange juice. Jossie Ng, RN

## 2022-02-04 NOTE — Progress Notes (Signed)
[facility]  PRENATAL VISIT NOTE  Subjective:  Nancy Shaffer is a 26 y.o. G3P2002 at [redacted]w[redacted]d being seen today for ongoing prenatal care.  She is currently monitored for the following issues for this high-risk pregnancy and has Family history of sickle cell disease; Obesity affecting pregnancy BMI=36.0; Supervision of high risk pregnancy, antepartum; Trichomonal vaginitis during pregnancy 08/27/21; Pap smear of cervix unsatisfactory; Anemia in pregnancy; Noncompliant pregnant patient; no prenatal care x 7 wks; Pica ice 32 oz daily; Absolute anemia; and IDA (iron deficiency anemia) on their problem list.  Patient reports no complaints.  Contractions: Not present. Vag. Bleeding: None.  Movement: Present. Denies leaking of fluid/ROM.   The following portions of the patient's history were reviewed and updated as appropriate: allergies, current medications, past family history, past medical history, past social history, past surgical history and problem list. Problem list updated.  Objective:   Vitals:   02/04/22 1104  BP: 113/76  Pulse: 87  Temp: (!) 96.8 F (36 C)  Weight: 221 lb 6.4 oz (100.4 kg)    Fetal Status: Fetal Heart Rate (bpm): 150 Fundal Height: 36 cm Movement: Present     General:  Alert, oriented and cooperative. Patient is in no acute distress.  Skin: Skin is warm and dry. No rash noted.   Cardiovascular: Normal heart rate noted  Respiratory: Normal respiratory effort, no problems with respiration noted  Abdomen: Soft, gravid, appropriate for gestational age.  Pain/Pressure: Absent     Pelvic: Cervical exam deferred        Extremities: Normal range of motion.  Edema: Trace  Mental Status: Normal mood and affect. Normal behavior. Normal judgment and thought content.   Assessment and Plan:  Pregnancy: G3P2002 at [redacted]w[redacted]d  1. Supervision of high risk pregnancy, antepartum Working 20 hrs/wk Living with her 2 kids Reviewed  01/30/22 u/s at 32 5/7 with AFI wnl, posterior placenta, EFW=14%, normal interval growth  2. Obesity affecting pregnancy, antepartum 15 lb 6.4 oz (6.985 kg) Walks 5x/wk at [facility] to walk continuously after work x 20 min 3 lb wt loss in past 2 wks  3. Anemia during pregnancy in third trimester Kept [facility] heme apt 01/27/22 and was told no need for IV iron infusion and to take FeSo4 I daily F/u heme apt 02/25/22 Denies other pica  4. Pica ice 32 oz daily Denies eating now  5. Noncompliant pregnant patient, antepartum    Preterm labor symptoms and general obstetric precautions including but not limited to vaginal bleeding, contractions, leaking of fluid and fetal movement were reviewed in detail with the patient. Please refer to After Visit Summary for other counseling recommendations.  Return in about 2 weeks (around 02/18/2022) for routine PNC, 36 wk labs.  Future Appointments  Date Time Provider Department Center  02/18/2022 10:40 AM [facility] PROVIDER AC-MAT None  02/25/2022 11:45 AM [facility] LAB [facility] None  04/30/2022 11:45 AM [facility] None  05/04/2022  1:00 PM Rickard Patience, MD [facility] None    Alberteen Spindle, CNM

## 2022-02-18 ENCOUNTER — Ambulatory Visit: Payer: Medicaid Other

## 2022-02-24 ENCOUNTER — Encounter: Payer: Self-pay | Admitting: Advanced Practice Midwife

## 2022-02-24 ENCOUNTER — Ambulatory Visit: Payer: Medicaid Other | Admitting: Advanced Practice Midwife

## 2022-02-24 VITALS — BP 114/81 | HR 95 | Temp 97.5°F | Wt 221.8 lb

## 2022-02-24 DIAGNOSIS — B951 Streptococcus, group B, as the cause of diseases classified elsewhere: Secondary | ICD-10-CM | POA: Insufficient documentation

## 2022-02-24 DIAGNOSIS — O99019 Anemia complicating pregnancy, unspecified trimester: Secondary | ICD-10-CM

## 2022-02-24 DIAGNOSIS — O0993 Supervision of high risk pregnancy, unspecified, third trimester: Secondary | ICD-10-CM

## 2022-02-24 DIAGNOSIS — O99210 Obesity complicating pregnancy, unspecified trimester: Secondary | ICD-10-CM

## 2022-02-24 NOTE — Progress Notes (Addendum)
[facility]  PRENATAL VISIT NOTE  Subjective:  Nancy Shaffer is a 26 y.o. G3P2002 at [redacted]w[redacted]d being seen today for ongoing prenatal care.  She is currently monitored for the following issues for this high-risk pregnancy and has Family history of sickle cell disease; Obesity affecting pregnancy BMI=36.0; Supervision of high risk pregnancy, antepartum; Trichomonal vaginitis during pregnancy 08/27/21; Pap smear of cervix unsatisfactory; Anemia in pregnancy; Noncompliant pregnant patient; no prenatal care x 7 wks; Pica ice 32 oz daily; Absolute anemia; and IDA (iron deficiency anemia) on their problem list.  Patient reports no complaints.  Contractions: Irritability. Vag. Bleeding: None.  Movement: Present. Denies leaking of fluid/ROM.   The following portions of the patient's history were reviewed and updated as appropriate: allergies, current medications, past family history, past medical history, past social history, past surgical history and problem list. Problem list updated.  Objective:   Vitals:   02/24/22 1456  BP: 114/81  Pulse: 95  Temp: (!) 97.5 F (36.4 C)  Weight: 221 lb 12.8 oz (100.6 kg)    Fetal Status: Fetal Heart Rate (bpm): 150 Fundal Height: 37 cm Movement: Present  Presentation: Vertex  General:  Alert, oriented and cooperative. Patient is in no acute distress.  Skin: Skin is warm and dry. No rash noted.   Cardiovascular: Normal heart rate noted  Respiratory: Normal respiratory effort, no problems with respiration noted  Abdomen: Soft, gravid, appropriate for gestational age.  Pain/Pressure: Present     Pelvic: Cervical exam deferred        Extremities: Normal range of motion.  Edema: Trace  Mental Status: Normal mood and affect. Normal behavior. Normal judgment and thought content.   Assessment and Plan:  Pregnancy: G3P2002 at [redacted]w[redacted]d  1. Supervision of high risk pregnancy in third trimester -26 year old female in  clinic for prenatal care. -Patient states she is taking PNV daily -No complaints today. -36 week labs today.  Reviewed with patient when to report to the hospital for delivery. -PHQ-9= 4  - Chlamydia/GC NAA, Confirmation - Culture, beta strep (group b only)  2. Obesity affecting pregnancy, antepartum -Patient states she walks daily for at least 30 minutes to 1 hour. -Encouraged frequent small meals high in protein, low in carbs, fat, and sugars and drink at least 6-8 bottles of water daily.   -15 lb 12.8 oz (7.167 kg)   3. Antepartum anemia -Patient has follow up hematology appointment on 02/25/22. -Patient states she continues to take Iron.  Encouraged Iron rich foods.    Term labor symptoms and general obstetric precautions including but not limited to vaginal bleeding, contractions, leaking of fluid and fetal movement were reviewed in detail with the patient. Please refer to After Visit Summary for other counseling recommendations.   Return in about 1 week (around 03/03/2022) for Routine prenatal care visit.  Future Appointments  Date Time Provider Department Center  02/25/2022 11:45 AM [facility] None  04/30/2022 11:45 AM [facility] None  05/04/2022  1:00 PM Rickard Patience, MD [facility] None    Glenna Fellows, FNP

## 2022-02-24 NOTE — Progress Notes (Signed)
Correctly verbalizes how to take iron and prenatal vitamin. Taking iron tablet with orange juice. Self-collecting 36 week cultures. Jossie Ng, RN

## 2022-02-25 ENCOUNTER — Inpatient Hospital Stay: Payer: Medicaid Other | Attending: Oncology

## 2022-02-25 DIAGNOSIS — O99013 Anemia complicating pregnancy, third trimester: Secondary | ICD-10-CM | POA: Insufficient documentation

## 2022-02-25 DIAGNOSIS — D509 Iron deficiency anemia, unspecified: Secondary | ICD-10-CM | POA: Diagnosis present

## 2022-02-25 DIAGNOSIS — D508 Other iron deficiency anemias: Secondary | ICD-10-CM

## 2022-02-25 LAB — RETIC PANEL
Immature Retic Fract: 17.2 % — ABNORMAL HIGH (ref 2.3–15.9)
RBC.: 4.15 MIL/uL (ref 3.87–5.11)
Retic Count, Absolute: 105 10*3/uL (ref 19.0–186.0)
Retic Ct Pct: 2.5 % (ref 0.4–3.1)
Reticulocyte Hemoglobin: 28.1 pg (ref >27.9)

## 2022-02-25 LAB — CBC WITH DIFFERENTIAL/PLATELET
Abs Immature Granulocytes: 0.07 10*3/uL (ref 0.00–0.07)
Basophils Absolute: 0 10*3/uL (ref 0.0–0.1)
Basophils Relative: 0 %
Eosinophils Absolute: 0 10*3/uL (ref 0.0–0.5)
Eosinophils Relative: 0 %
HCT: 34 % — ABNORMAL LOW (ref 36.0–46.0)
Hemoglobin: 10.8 g/dL — ABNORMAL LOW (ref 12.0–15.0)
Immature Granulocytes: 1 %
Lymphocytes Relative: 21 %
Lymphs Abs: 2 10*3/uL (ref 0.7–4.0)
MCH: 26.3 pg (ref 26.0–34.0)
MCHC: 31.8 g/dL (ref 30.0–36.0)
MCV: 82.9 fL (ref 80.0–100.0)
Monocytes Absolute: 0.6 10*3/uL (ref 0.1–1.0)
Monocytes Relative: 6 %
Neutro Abs: 6.7 10*3/uL (ref 1.7–7.7)
Neutrophils Relative %: 72 %
Platelets: 320 10*3/uL (ref 150–400)
RBC: 4.1 MIL/uL (ref 3.87–5.11)
RDW: 15.5 % (ref 11.5–15.5)
WBC: 9.4 10*3/uL (ref 4.0–10.5)
nRBC: 0 % (ref 0.0–0.2)

## 2022-02-25 LAB — IRON AND TIBC
Iron: 41 ug/dL (ref 28–170)
Saturation Ratios: 8 % — ABNORMAL LOW (ref 10.4–31.8)
TIBC: 517 ug/dL — ABNORMAL HIGH (ref 250–450)
UIBC: 476 ug/dL

## 2022-02-25 LAB — FERRITIN: Ferritin: 14 ng/mL (ref 11–307)

## 2022-02-26 ENCOUNTER — Telehealth: Payer: Self-pay

## 2022-02-26 LAB — CHLAMYDIA/GC NAA, CONFIRMATION
Chlamydia trachomatis, NAA: NEGATIVE
Neisseria gonorrhoeae, NAA: NEGATIVE

## 2022-02-26 NOTE — Telephone Encounter (Signed)
-----   Message from Rickard Patience, MD sent at 02/26/2022 12:46 AM EDT ----- She is persistently iron deficient, her hemoglobin level is decreased at 10.8, similar to her level one month ago. Ask if she would consider IV venofer, if yes, please schedule IV venofer weekly x 3.

## 2022-02-26 NOTE — Telephone Encounter (Signed)
Spoke to patient and relayed results and recommendations. Pt does not want to proceed with IV iron, she will continue to take her oral iron supplements.   She is scheduled to come back 9/14- labs and 9/18-MD. Ok to keep appts or did you want to move up?

## 2022-02-27 ENCOUNTER — Encounter: Payer: Self-pay | Admitting: Oncology

## 2022-02-27 ENCOUNTER — Encounter: Payer: Self-pay | Admitting: Obstetrics and Gynecology

## 2022-02-27 ENCOUNTER — Observation Stay
Admission: EM | Admit: 2022-02-27 | Discharge: 2022-02-27 | Disposition: A | Discharge status: Home / Self Care | Payer: Medicaid Other

## 2022-02-27 ENCOUNTER — Other Ambulatory Visit: Payer: Self-pay

## 2022-02-27 DIAGNOSIS — A5901 Trichomonal vulvovaginitis: Secondary | ICD-10-CM

## 2022-02-27 DIAGNOSIS — O0990 Supervision of high risk pregnancy, unspecified, unspecified trimester: Secondary | ICD-10-CM

## 2022-02-27 DIAGNOSIS — O23593 Infection of other part of genital tract in pregnancy, third trimester: Secondary | ICD-10-CM | POA: Diagnosis not present

## 2022-02-27 DIAGNOSIS — B9689 Other specified bacterial agents as the cause of diseases classified elsewhere: Secondary | ICD-10-CM | POA: Insufficient documentation

## 2022-02-27 DIAGNOSIS — Z3A37 37 weeks gestation of pregnancy: Secondary | ICD-10-CM | POA: Diagnosis not present

## 2022-02-27 DIAGNOSIS — O99019 Anemia complicating pregnancy, unspecified trimester: Secondary | ICD-10-CM

## 2022-02-27 DIAGNOSIS — Z0371 Encounter for suspected problem with amniotic cavity and membrane ruled out: Secondary | ICD-10-CM | POA: Diagnosis present

## 2022-02-27 DIAGNOSIS — N760 Acute vaginitis: Secondary | ICD-10-CM | POA: Diagnosis present

## 2022-02-27 DIAGNOSIS — O4193X0 Disorder of amniotic fluid and membranes, unspecified, third trimester, not applicable or unspecified: Principal | ICD-10-CM | POA: Insufficient documentation

## 2022-02-27 LAB — URINALYSIS, COMPLETE (UACMP) WITH MICROSCOPIC
Bilirubin Urine: NEGATIVE
Glucose, UA: NEGATIVE mg/dL
Hgb urine dipstick: NEGATIVE
Ketones, ur: NEGATIVE mg/dL
Leukocytes,Ua: NEGATIVE
Nitrite: NEGATIVE
Protein, ur: 30 mg/dL — AB
Specific Gravity, Urine: 1.023 (ref 1.005–1.030)
pH: 5 (ref 5.0–8.0)

## 2022-02-27 LAB — RUPTURE OF MEMBRANE (ROM)PLUS: Rom Plus: NEGATIVE

## 2022-02-27 LAB — WET PREP, GENITAL
Sperm: NONE SEEN
Trich, Wet Prep: NONE SEEN
WBC, Wet Prep HPF POC: <10 (ref <10)
Yeast Wet Prep HPF POC: NONE SEEN

## 2022-02-27 MED ORDER — METRONIDAZOLE 0.75 % VA GEL
1.0000 | Freq: Every day | VAGINAL | 0 refills | Status: AC
Start: 2022-02-27 — End: 2022-03-04

## 2022-02-27 MED ORDER — CALCIUM CARBONATE ANTACID 500 MG PO CHEW: 2.0000 | CHEWABLE_TABLET | ORAL | Status: DC | PRN

## 2022-02-27 MED ORDER — ACETAMINOPHEN 500 MG PO TABS: 1000.0000 mg | ORAL_TABLET | Freq: Four times a day (QID) | ORAL | Status: DC | PRN

## 2022-02-27 NOTE — OB Triage Note (Signed)
Pt G3P2 37w4 days presents for LOF for the last 4 days. Pt feels like she needs to pee frequently but its only a trickle. Pt reports as clear and watery. Pt has no urinary symptoms. +FM. Denies bleeding. Reports abdominal tightening several times a day that she rates 7/10. VSS. Monitors applied.

## 2022-02-27 NOTE — Discharge Summary (Signed)
Nancy Shaffer is a 26 y.o. female. She is at [redacted]w[redacted]d gestation. Patient's last menstrual period was 06/09/2021 (exact date). Estimated Date of Delivery: 03/16/22  Prenatal care site: [facility]  Chief complaint: leaking of fluid   HPI: Nancy Shaffer presents to L&D with complaints of leakage of fluid for the past 4 days.  Reports small amounts or trickles over the past couple of days.  Denies regular contractions or vaginal bleeding.  Endorses good fetal movement.   Factors complicating pregnancy: Obesity in pregnancy  Trichomonal vaginitis during pregnancy - treated 08/27/2021 Maternal iron deficiency anemia in pregnancy  Family history of sickle cell anemia   S: Resting comfortably. no CTX, no VB.  Active fetal movement.   Maternal Medical History:  Past Medical Hx:  has a past medical history of Anemia, Headache, and Pica ice 32 oz daily (12/10/2021).    Past Surgical Hx:  has a past surgical history that includes No past surgeries.   No Known Allergies   Prior to Admission medications   Medication Sig Start Date End Date Taking? Authorizing Provider  Iron, Ferrous Sulfate, 325 (65 Fe) MG TABS Take 1 tablet by mouth daily at 6 (six) AM. 11/12/21  Yes Federico Flake, MD  metroNIDAZOLE (METROGEL VAGINAL) 0.75 % vaginal gel Place 1 Applicatorful vaginally at bedtime for 5 days. 02/27/22 03/04/22 Yes Gustavo Lah, CNM  Prenatal Vit-Fe Fumarate-FA (PRENATAL MULTIVITAMIN) TABS tablet Take 1 tablet by mouth daily at 12 noon.   Yes [provider]    Social History: She  reports that she has never smoked. She has never been exposed to tobacco smoke. She has never used smokeless tobacco. She reports that she does not currently use alcohol after a past usage of about 2.0 standard drinks of alcohol per week. She reports that she does not currently use drugs.  Family History: family history includes Arthritis in her paternal grandmother; Diabetes in her maternal grandfather; Healthy  in her daughter, mother, and sister; Sickle cell anemia in her brother; Sickle cell trait in her father.   Review of Systems: A full review of systems was performed and negative except as noted in the HPI.    O:  BP 121/76 (BP Location: Left Arm)   Pulse 98   Temp 98 F (36.7 C) (Oral)   Resp 17   Ht 5\' 4"  (1.626 m)   Wt 100.4 kg   LMP 06/09/2021 (Exact Date)   BMI 38.00 kg/m  Results for orders placed or performed during the hospital encounter of 02/27/22 (from the past 48 hour(s))  Wet prep, genital   Collection Time: 02/27/22  3:56 PM  Result Value Ref Range   Yeast Wet Prep HPF POC NONE SEEN NONE SEEN   Trich, Wet Prep NONE SEEN NONE SEEN   Clue Cells Wet Prep HPF POC PRESENT (A) NONE SEEN   WBC, Wet Prep HPF POC <10 <10   Sperm NONE SEEN   ROM Plus ([facility] only)   Collection Time: 02/27/22  3:56 PM  Result Value Ref Range   Rom Plus NEGATIVE   Urinalysis, Complete w Microscopic   Collection Time: 02/27/22  3:56 PM  Result Value Ref Range   Color, Urine AMBER (A) YELLOW   APPearance HAZY (A) CLEAR   Specific Gravity, Urine 1.023 1.005 - 1.030   pH 5.0 5.0 - 8.0   Glucose, UA NEGATIVE NEGATIVE mg/dL   Hgb urine dipstick NEGATIVE NEGATIVE   Bilirubin Urine NEGATIVE NEGATIVE   Ketones, ur NEGATIVE NEGATIVE  mg/dL   Protein, ur 30 (A) NEGATIVE mg/dL   Nitrite NEGATIVE NEGATIVE   Leukocytes,Ua NEGATIVE NEGATIVE   RBC / HPF 0-5 0 - 5 RBC/hpf   WBC, UA 0-5 0 - 5 WBC/hpf   Bacteria, UA RARE (A) NONE SEEN   Squamous Epithelial / LPF 6-10 0 - 5   Mucus PRESENT      Constitutional: NAD, AAOx3  PULM: nl respiratory effort Abd: gravid Ext: Nonedmeatous Psych: mood appropriate, speech normal SVE: deferred   Fetal Monitor: Baseline: 140 bpm Variability: moderate Accels: Present Decels: none Toco: occasional mild contractions   Category: I NST: Reactive    Assessment: 26 y.o. [redacted]w[redacted]d here for antenatal surveillance during pregnancy.  Principle diagnosis: Encounter  for suspected PROM, with rupture of membranes not found; Bacterial vaginosis in pregnancy    Plan: Labor: not present.  NST reviewed - reactive tracing  Fetal Wellbeing: Reassuring Cat 1 tracing. ROM plus negative and no continued LOF noted during triage visit  Wet prep positive for bacterial vaginosis  Rx for metrogel sent to pharmacy  D/c home stable, precautions reviewed, follow-up as scheduled.   Allergies as of 02/27/2022   No Known Allergies      Medication List     TAKE these medications    Iron (Ferrous Sulfate) 325 (65 Fe) MG Tabs Take 1 tablet by mouth daily at 6 (six) AM.   metroNIDAZOLE 0.75 % vaginal gel Commonly known as: METROGEL VAGINAL Place 1 Applicatorful vaginally at bedtime for 5 days.   prenatal multivitamin Tabs tablet Take 1 tablet by mouth daily at 12 noon.       [facility] DEPT [address] [place] [address] [phone] Schedule an appointment as soon as possible for a visit in 1 week(s) routine prenatal visit   ----- Margaretmary Eddy, CNM Certified Nurse Midwife [facility]

## 2022-02-28 LAB — CULTURE, BETA STREP (GROUP B ONLY): Strep Gp B Culture: POSITIVE — AB

## 2022-03-06 ENCOUNTER — Other Ambulatory Visit: Payer: Self-pay

## 2022-03-06 ENCOUNTER — Inpatient Hospital Stay
Admission: EM | Admit: 2022-03-06 | Discharge: 2022-03-08 | DRG: 806 | Disposition: A | Discharge status: Home / Self Care | Payer: Medicaid Other

## 2022-03-06 ENCOUNTER — Encounter: Payer: Self-pay | Admitting: Obstetrics and Gynecology

## 2022-03-06 DIAGNOSIS — Z3A38 38 weeks gestation of pregnancy: Secondary | ICD-10-CM

## 2022-03-06 DIAGNOSIS — O99214 Obesity complicating childbirth: Principal | ICD-10-CM | POA: Diagnosis present

## 2022-03-06 DIAGNOSIS — O99210 Obesity complicating pregnancy, unspecified trimester: Secondary | ICD-10-CM

## 2022-03-06 DIAGNOSIS — O26893 Other specified pregnancy related conditions, third trimester: Secondary | ICD-10-CM | POA: Diagnosis present

## 2022-03-06 DIAGNOSIS — O0990 Supervision of high risk pregnancy, unspecified, unspecified trimester: Secondary | ICD-10-CM

## 2022-03-06 DIAGNOSIS — D62 Acute posthemorrhagic anemia: Secondary | ICD-10-CM | POA: Diagnosis not present

## 2022-03-06 DIAGNOSIS — O99824 Streptococcus B carrier state complicating childbirth: Secondary | ICD-10-CM | POA: Diagnosis present

## 2022-03-06 DIAGNOSIS — O9081 Anemia of the puerperium: Secondary | ICD-10-CM | POA: Diagnosis not present

## 2022-03-06 DIAGNOSIS — O99019 Anemia complicating pregnancy, unspecified trimester: Secondary | ICD-10-CM

## 2022-03-06 DIAGNOSIS — B951 Streptococcus, group B, as the cause of diseases classified elsewhere: Secondary | ICD-10-CM

## 2022-03-06 DIAGNOSIS — N898 Other specified noninflammatory disorders of vagina: Secondary | ICD-10-CM | POA: Diagnosis present

## 2022-03-06 DIAGNOSIS — B9689 Other specified bacterial agents as the cause of diseases classified elsewhere: Principal | ICD-10-CM

## 2022-03-06 DIAGNOSIS — A5901 Trichomonal vulvovaginitis: Secondary | ICD-10-CM

## 2022-03-06 LAB — CBC
HCT: 36.6 % (ref 36.0–46.0)
Hemoglobin: 11.4 g/dL — ABNORMAL LOW (ref 12.0–15.0)
MCH: 26 pg (ref 26.0–34.0)
MCHC: 31.1 g/dL (ref 30.0–36.0)
MCV: 83.4 fL (ref 80.0–100.0)
Platelets: 372 10*3/uL (ref 150–400)
RBC: 4.39 MIL/uL (ref 3.87–5.11)
RDW: 15.4 % (ref 11.5–15.5)
WBC: 12.1 10*3/uL — ABNORMAL HIGH (ref 4.0–10.5)
nRBC: 0 % (ref 0.0–0.2)

## 2022-03-06 LAB — TYPE AND SCREEN
ABO/RH(D): A POS
Antibody Screen: NEGATIVE

## 2022-03-06 LAB — RUPTURE OF MEMBRANE (ROM)PLUS: Rom Plus: POSITIVE

## 2022-03-06 MED ORDER — LACTATED RINGERS IV SOLN: 500.0000 mL | INTRAVENOUS | Status: DC | PRN

## 2022-03-06 MED ORDER — ONDANSETRON HCL 4 MG/2ML IJ SOLN
4.0000 mg | Freq: Four times a day (QID) | INTRAMUSCULAR | Status: DC | PRN
  Administered 2022-03-06: 4 mg via INTRAVENOUS
  Filled 2022-03-06: qty 2

## 2022-03-06 MED ORDER — PRENATAL MULTIVITAMIN CH: 1.0000 | ORAL_TABLET | Freq: Every day | ORAL | Status: DC

## 2022-03-06 MED ORDER — OXYCODONE-ACETAMINOPHEN 5-325 MG PO TABS: 2.0000 | ORAL_TABLET | ORAL | Status: DC | PRN

## 2022-03-06 MED ORDER — FENTANYL CITRATE (PF) 100 MCG/2ML IJ SOLN
INTRAMUSCULAR | Status: AC
  Administered 2022-03-06: 100 ug via INTRAVENOUS
  Filled 2022-03-06: qty 2

## 2022-03-06 MED ORDER — ACETAMINOPHEN 325 MG PO TABS: 650.0000 mg | ORAL_TABLET | ORAL | Status: DC | PRN

## 2022-03-06 MED ORDER — LIDOCAINE HCL (PF) 1 % IJ SOLN: 30.0000 mL | INTRAMUSCULAR | Status: DC | PRN

## 2022-03-06 MED ORDER — OXYTOCIN 10 UNIT/ML IJ SOLN
INTRAMUSCULAR | Status: AC
  Filled 2022-03-06: qty 2

## 2022-03-06 MED ORDER — LACTATED RINGERS IV SOLN: INTRAVENOUS | Status: DC

## 2022-03-06 MED ORDER — OXYTOCIN BOLUS FROM INFUSION
333.0000 mL | Freq: Once | INTRAVENOUS | Status: AC
  Administered 2022-03-06: 333 mL via INTRAVENOUS

## 2022-03-06 MED ORDER — FENTANYL CITRATE (PF) 100 MCG/2ML IJ SOLN: 100.0000 ug | Freq: Once | INTRAMUSCULAR | Status: AC

## 2022-03-06 MED ORDER — SOD CITRATE-CITRIC ACID 500-334 MG/5ML PO SOLN: 30.0000 mL | ORAL | Status: DC | PRN

## 2022-03-06 MED ORDER — MISOPROSTOL 200 MCG PO TABS
ORAL_TABLET | ORAL | Status: AC
  Filled 2022-03-06: qty 4

## 2022-03-06 MED ORDER — AMMONIA AROMATIC IN INHA
RESPIRATORY_TRACT | Status: AC
  Filled 2022-03-06: qty 10

## 2022-03-06 MED ORDER — OXYCODONE-ACETAMINOPHEN 5-325 MG PO TABS: 1.0000 | ORAL_TABLET | ORAL | Status: DC | PRN

## 2022-03-06 MED ORDER — OXYTOCIN-SODIUM CHLORIDE 30-0.9 UT/500ML-% IV SOLN
2.5000 [IU]/h | INTRAVENOUS | Status: DC
  Administered 2022-03-07: 2.5 [IU]/h via INTRAVENOUS
  Filled 2022-03-06: qty 500

## 2022-03-06 MED ORDER — FENTANYL CITRATE (PF) 100 MCG/2ML IJ SOLN: 100.0000 ug | INTRAMUSCULAR | Status: DC | PRN

## 2022-03-06 MED ORDER — PENICILLIN G POT IN DEXTROSE 60000 UNIT/ML IV SOLN
3.0000 10*6.[IU] | INTRAVENOUS | Status: DC
  Administered 2022-03-06 (×2): 3 10*6.[IU] via INTRAVENOUS
  Filled 2022-03-06 (×3): qty 50

## 2022-03-06 MED ORDER — SODIUM CHLORIDE 0.9 % IV SOLN
5.0000 10*6.[IU] | Freq: Once | INTRAVENOUS | Status: AC
  Administered 2022-03-06: 5 10*6.[IU] via INTRAVENOUS
  Filled 2022-03-06: qty 5

## 2022-03-06 MED ORDER — LIDOCAINE HCL (PF) 1 % IJ SOLN
INTRAMUSCULAR | Status: AC
  Filled 2022-03-06: qty 30

## 2022-03-06 MED ORDER — CALCIUM CARBONATE ANTACID 500 MG PO CHEW: 2.0000 | CHEWABLE_TABLET | ORAL | Status: DC | PRN

## 2022-03-06 NOTE — H&P (Signed)
OB History & Physical   History of Present Illness:  Chief Complaint:   HPI:  Nancy Shaffer is a 26 y.o. G60P2002 female at [redacted]w[redacted]d dated by LMP.  She presents to L&D for active labor and SROM  She reports:  -active fetal movement -LOF/SROM at 0630 -bloody show at 0630 -onset of contractions last night   Pregnancy Issues: 1. Trichomonas 2. Pica 3. Obesity 4. Limited or no PNC 5. Anemia 6. GBS pos   Maternal Medical History:   Past Medical History:  Diagnosis Date   Anemia    Headache    Pica ice 32 oz daily 12/10/2021    Past Surgical History:  Procedure Laterality Date   NO PAST SURGERIES      No Known Allergies  Prior to Admission medications   Medication Sig Start Date End Date Taking? Authorizing Provider  Iron, Ferrous Sulfate, 325 (65 Fe) MG TABS Take 1 tablet by mouth daily at 6 (six) AM. 11/12/21   Federico Flake, MD  Prenatal Vit-Fe Fumarate-FA (PRENATAL MULTIVITAMIN) TABS tablet Take 1 tablet by mouth daily at 12 noon.    [provider]     Prenatal care site: [facility]   Social History: She  reports that she has never smoked. She has never been exposed to tobacco smoke. She has never used smokeless tobacco. She reports that she does not currently use alcohol after a past usage of about 2.0 standard drinks of alcohol per week. She reports that she does not currently use drugs.  Family History: family history includes Arthritis in her paternal grandmother; Diabetes in her maternal grandfather; Healthy in her daughter, mother, and sister; Sickle cell anemia in her brother; Sickle cell trait in her father.   Review of Systems: A full review of systems was performed and negative except as noted in the HPI.    Physical Exam:  Vital Signs: BP 109/71   Pulse 85   Temp 98.7 F (37.1 C) (Oral)   Resp 18   Ht 5\' 4"  (1.626 m)   Wt 101.8 kg   LMP 06/09/2021 (Exact Date)   BMI 38.52 kg/m   General:   alert and  cooperative  Skin:  normal  Neurologic:    Alert & oriented x 3  Lungs:    Nl effort  Heart:   regular rate and rhythm  Abdomen:  soft, non-tender; bowel sounds normal; no masses,  no organomegaly  Extremities: : non-tender, symmetric, no edema bilaterally.      EFW: 01/30/22 14%  Results for orders placed or performed during the hospital encounter of 03/06/22 (from the past 24 hour(s))  ROM Plus ([facility] only)     Status: None   Collection Time: 03/06/22  7:30 AM  Result Value Ref Range   Rom Plus POSITIVE   CBC     Status: Abnormal   Collection Time: 03/06/22  9:41 AM  Result Value Ref Range   WBC 12.1 (H) 4.0 - 10.5 K/uL   RBC 4.39 3.87 - 5.11 MIL/uL   Hemoglobin 11.4 (L) 12.0 - 15.0 g/dL   HCT 03/08/22 80.9 - 98.3 %   MCV 83.4 80.0 - 100.0 fL   MCH 26.0 26.0 - 34.0 pg   MCHC 31.1 30.0 - 36.0 g/dL   RDW 38.2 50.5 - 39.7 %   Platelets 372 150 - 400 K/uL   nRBC 0.0 0.0 - 0.2 %  Type and screen [facility]     Status: None  Collection Time: 03/06/22  9:41 AM  Result Value Ref Range   ABO/RH(D) A POS    Antibody Screen NEG    Sample Expiration      03/09/2022,2359 Performed at [facility], [address] [place], [place] [address]     Pertinent Results:  Prenatal Labs: Blood type/Rh A pos  Antibody screen neg  Rubella Immune  Varicella Immune  RPR NR  HBsAg Neg  HIV NR  GC neg  Chlamydia neg  Genetic screening negative  1 hour GTT 108  3 hour GTT   GBS POS   FHT: FHR: 145 bpm, variability: moderate,  accelerations:  Present,  decelerations:  Absent Category/reactivity:  Category I TOCO: unclear at this time SVE: Dilation: 3.5 / Effacement (%): 50 / Station: -2      Assessment:  Nancy Shaffer is a 26 y.o. G8P2002 female at [redacted]w[redacted]d with active labor and SROM.   Plan:  1. Admit to Labor & Delivery; consents reviewed and obtained  2. Fetal Well being  - Fetal Tracing: cat I - GBS pos - Presentation: vtx confirmed by sve    3. Routine OB: - Prenatal labs reviewed, as above - Rh pos - CBC & T&S on admit - Clear fluids, IVF  4. Monitoring of Labor -  Contractions by external toco in place -  Pelvis proven to 3090g -  Plan for continuous fetal monitoring  -  Maternal pain control as desired: IVPM, nitrous, regional anesthesia - Anticipate vaginal delivery  5. Post Partum Planning: - Infant feeding: Breast and bottle - Contraception: OCPs - Tdap: 01/23/22 - Flu declined 08/27/21  Haroldine Laws, CNM 03/06/2022 10:54 AM

## 2022-03-06 NOTE — OB Triage Note (Signed)
Pt is a G3P2  at 38.4 weeks c/o LOF at 0630 and ctx that started last night around 2245.  Some bloody show, no OB complications or pregnancy concerns.

## 2022-03-06 NOTE — Progress Notes (Signed)
Labor Progress Note  Nancy Shaffer is a 26 y.o. G3P2002 at [redacted]w[redacted]d by LMP admitted for rupture of membranes  Subjective: Coping well, using birth ball.  Objective: BP 113/72   Pulse 86   Temp 97.9 F (36.6 C) (Oral)   Resp 19   Ht 5\' 4"  (1.626 m)   Wt 101.8 kg   LMP 06/09/2021 (Exact Date)   BMI 38.52 kg/m   Fetal Assessment:  IA FHT:  FHR: 145 bpm, variability: moderate,  accelerations:  Present,  decelerations:  Absent Category/reactivity:  Category I UC:   regular, every 1-9 minutes SVE:    Dilation: 3cm  Effacement: 80%  Station:  -3  Consistency: soft  Position: posterior  Membrane status: SROM at 0902 - bag felt on baby's head, questionable SROM Amniotic color: Clear  Labs: Lab Results  Component Value Date   WBC 12.1 (H) 03/06/2022   HGB 11.4 (L) 03/06/2022   HCT 36.6 03/06/2022   MCV 83.4 03/06/2022   PLT 372 03/06/2022    Assessment / Plan: SROM without labor Pt has been walking and using the birth ball Attempted AROM, pt tolerated well, will retry again in 1 hr  Labor:  SROM without labor Preeclampsia:   113/72 Fetal Wellbeing:  Category I Pain Control:  Labor support without medications I/D:   Afebrile, GBS pos - treated x2, SROM x 8hr Anticipated MOD:  NSVD  03/08/2022, CNM 03/06/2022, 5:39 PM

## 2022-03-06 NOTE — Progress Notes (Signed)
Labor Progress Note  Nancy Shaffer is a 26 y.o. G3P2002 at [redacted]w[redacted]d by LMP admitted for rupture of membranes  Subjective: Pt is coping well with UCs. Does not want to use Pitocin until we have exhausted all other options. Pt is aware infection risk.  Objective: BP 113/72   Pulse 86   Temp 97.9 F (36.6 C) (Oral)   Resp 19   Ht 5\' 4"  (1.626 m)   Wt 101.8 kg   LMP 06/09/2021 (Exact Date)   BMI 38.52 kg/m   Fetal Assessment: FHT:  FHR: 140 bpm, variability: moderate,  accelerations:  Present,  decelerations:  Absent Category/reactivity:  Category I UC:   regular, every 1-9 minutes SVE:    Dilation: 3cm  Effacement: 80%  Station:  -3  Consistency: soft  Position: posterior  Membrane status: SROM at 0902 Amniotic color: Clear  Labs: Lab Results  Component Value Date   WBC 12.1 (H) 03/06/2022   HGB 11.4 (L) 03/06/2022   HCT 36.6 03/06/2022   MCV 83.4 03/06/2022   PLT 372 03/06/2022    Assessment / Plan: SROM without labor, pt would like to avoid Pitocin if possible Pt requested to walk  Labor:  SROM without labor Preeclampsia:  no signs or symptoms of toxicity Fetal Wellbeing:  Category I Pain Control:  Labor support without medications I/D:   Afebrile, GBS pos - treated x2, SROM x 6hr Anticipated MOD:  NSVD  03/08/2022, CNM 03/06/2022, 3:13 PM

## 2022-03-07 ENCOUNTER — Encounter: Payer: Self-pay | Admitting: Obstetrics and Gynecology

## 2022-03-07 LAB — CBC
HCT: 29.6 % — ABNORMAL LOW (ref 36.0–46.0)
Hemoglobin: 9.3 g/dL — ABNORMAL LOW (ref 12.0–15.0)
MCH: 26.1 pg (ref 26.0–34.0)
MCHC: 31.4 g/dL (ref 30.0–36.0)
MCV: 82.9 fL (ref 80.0–100.0)
Platelets: 325 10*3/uL (ref 150–400)
RBC: 3.57 MIL/uL — ABNORMAL LOW (ref 3.87–5.11)
RDW: 15.3 % (ref 11.5–15.5)
WBC: 18.3 10*3/uL — ABNORMAL HIGH (ref 4.0–10.5)
nRBC: 0 % (ref 0.0–0.2)

## 2022-03-07 LAB — RPR: RPR Ser Ql: NONREACTIVE

## 2022-03-07 MED ORDER — IBUPROFEN 600 MG PO TABS
ORAL_TABLET | ORAL | Status: AC
  Filled 2022-03-07: qty 1

## 2022-03-07 MED ORDER — DIPHENHYDRAMINE HCL 25 MG PO CAPS: 25.0000 mg | ORAL_CAPSULE | Freq: Four times a day (QID) | ORAL | Status: DC | PRN

## 2022-03-07 MED ORDER — IBUPROFEN 600 MG PO TABS
600.0000 mg | ORAL_TABLET | Freq: Four times a day (QID) | ORAL | Status: DC
  Administered 2022-03-07 – 2022-03-08 (×3): 600 mg via ORAL
  Filled 2022-03-07 (×3): qty 1

## 2022-03-07 MED ORDER — SENNOSIDES-DOCUSATE SODIUM 8.6-50 MG PO TABS
2.0000 | ORAL_TABLET | Freq: Every day | ORAL | Status: DC
  Administered 2022-03-07 – 2022-03-08 (×2): 2 via ORAL
  Filled 2022-03-07 (×2): qty 2

## 2022-03-07 MED ORDER — ONDANSETRON HCL 4 MG PO TABS: 4.0000 mg | ORAL_TABLET | ORAL | Status: DC | PRN

## 2022-03-07 MED ORDER — ACETAMINOPHEN 325 MG PO TABS
650.0000 mg | ORAL_TABLET | ORAL | Status: DC | PRN
  Administered 2022-03-07 – 2022-03-08 (×3): 650 mg via ORAL
  Filled 2022-03-07 (×3): qty 2

## 2022-03-07 MED ORDER — OXYCODONE HCL 5 MG PO TABS
5.0000 mg | ORAL_TABLET | ORAL | Status: DC | PRN
  Administered 2022-03-07: 5 mg via ORAL
  Filled 2022-03-07: qty 1

## 2022-03-07 MED ORDER — IBUPROFEN 600 MG PO TABS
600.0000 mg | ORAL_TABLET | Freq: Four times a day (QID) | ORAL | Status: DC
  Administered 2022-03-07 (×3): 600 mg via ORAL
  Filled 2022-03-07 (×2): qty 1

## 2022-03-07 MED ORDER — SIMETHICONE 80 MG PO CHEW: 80.0000 mg | CHEWABLE_TABLET | ORAL | Status: DC | PRN

## 2022-03-07 MED ORDER — WITCH HAZEL-GLYCERIN EX PADS
1.0000 | MEDICATED_PAD | CUTANEOUS | Status: DC | PRN
  Administered 2022-03-07: 1 via TOPICAL
  Filled 2022-03-07: qty 100

## 2022-03-07 MED ORDER — BENZOCAINE-MENTHOL 20-0.5 % EX AERO
1.0000 | INHALATION_SPRAY | CUTANEOUS | Status: DC | PRN
  Administered 2022-03-07: 1 via TOPICAL
  Filled 2022-03-07 (×2): qty 56

## 2022-03-07 MED ORDER — DIBUCAINE (PERIANAL) 1 % EX OINT: 1.0000 | TOPICAL_OINTMENT | CUTANEOUS | Status: DC | PRN

## 2022-03-07 MED ORDER — TETANUS-DIPHTH-ACELL PERTUSSIS 5-2.5-18.5 LF-MCG/0.5 IM SUSY
0.5000 mL | PREFILLED_SYRINGE | Freq: Once | INTRAMUSCULAR | Status: DC
  Filled 2022-03-07: qty 0.5

## 2022-03-07 MED ORDER — OXYCODONE HCL 5 MG PO TABS
10.0000 mg | ORAL_TABLET | ORAL | Status: DC | PRN
  Administered 2022-03-07: 10 mg via ORAL
  Filled 2022-03-07: qty 2

## 2022-03-07 MED ORDER — ONDANSETRON HCL 4 MG/2ML IJ SOLN: 4.0000 mg | INTRAMUSCULAR | Status: DC | PRN

## 2022-03-07 MED ORDER — FERROUS SULFATE 325 (65 FE) MG PO TABS
325.0000 mg | ORAL_TABLET | Freq: Two times a day (BID) | ORAL | Status: DC
  Administered 2022-03-07 – 2022-03-08 (×3): 325 mg via ORAL
  Filled 2022-03-07 (×3): qty 1

## 2022-03-07 MED ORDER — COCONUT OIL OIL: 1.0000 | TOPICAL_OIL | Status: DC | PRN

## 2022-03-07 MED ORDER — PRENATAL MULTIVITAMIN CH
1.0000 | ORAL_TABLET | Freq: Every day | ORAL | Status: DC
  Administered 2022-03-07: 1 via ORAL
  Filled 2022-03-07: qty 1

## 2022-03-07 NOTE — Progress Notes (Signed)
Postpartum Day  1  Subjective: no complaints, up ad lib, voiding, and tolerating PO  Doing well, no concerns. Ambulating without difficulty, pain managed with PO meds, tolerating regular diet, and voiding without difficulty.   No fever/chills, chest pain, shortness of breath, nausea/vomiting, or leg pain. No nipple or breast pain. No headache, visual changes, or RUQ/epigastric pain.  Objective: BP 104/72 (BP Location: Right Arm)   Pulse 80   Temp 98.5 F (36.9 C) (Oral)   Resp 18   Ht 5\' 4"  (1.626 m)   Wt 101.8 kg   LMP 06/09/2021 (Exact Date)   SpO2 99%   Breastfeeding Unknown   BMI 38.52 kg/m    Physical Exam:  General: alert, cooperative, and no distress Breasts: soft/nontender CV: RRR Pulm: nl effort, CTABL Abdomen: soft, non-tender, active bowel sounds Uterine Fundus: firm Perineum: minimal edema, intact Lochia: appropriate DVT Evaluation: No evidence of DVT seen on physical exam.  Recent Labs    03/06/22 0941 03/07/22 0538  HGB 11.4* 9.3*  HCT 36.6 29.6*  WBC 12.1* 18.3*  PLT 372 325    Assessment/Plan: 26 y.o. G3P3003 postpartum day # 1  -Continue routine postpartum care -Lactation consult PRN for breastfeeding  -Discussed contraceptive options including implant, IUDs hormonal and non-hormonal, injection, pills/ring/patch, condoms, and NFP.  Desires Liletta IUD  -Acute blood loss anemia - hemodynamically stable and asymptomatic; start PO ferrous sulfate BID with stool softeners  -Immunization status:   all immunizations up to date   Disposition: Continue inpatient postpartum care    LOS: 1 day   34, CNM 03/07/2022, 11:22 AM   ----- 03/09/2022  Certified Nurse Midwife [facility]

## 2022-03-07 NOTE — Discharge Instructions (Signed)
Vaginal Delivery, Care After Refer to this sheet in the next few weeks. These discharge instructions provide you with information on caring for yourself after delivery. Your caregiver may also give you specific instructions. Your treatment has been planned according to the most current medical practices available, but problems sometimes occur. Call your caregiver if you have any problems or questions after you go home. HOME CARE INSTRUCTIONS Take over-the-counter or prescription medicines only as directed by your caregiver or pharmacist. Do not drink alcohol, especially if you are breastfeeding or taking medicine to relieve pain. Do not smoke tobacco. Continue to use good perineal care. Good perineal care includes: Wiping your perineum from back to front Keeping your perineum clean. You can do sitz baths twice a day, to help keep this area clean Do not use tampons, douche or have sex until your caregiver says it is okay. Shower only and avoid sitting in submerged water, aside from sitz baths Wear a well-fitting bra that provides breast support. Eat healthy foods. Drink enough fluids to keep your urine clear or pale yellow. Eat high-fiber foods such as whole grain cereals and breads, brown rice, beans, and fresh fruits and vegetables every day. These foods may help prevent or relieve constipation. Avoid constipation with high fiber foods or medications, such as miralax or metamucil Follow your caregiver's recommendations regarding resumption of activities such as climbing stairs, driving, lifting, exercising, or traveling. Talk to your caregiver about resuming sexual activities. Resumption of sexual activities is dependent upon your risk of infection, your rate of healing, and your comfort and desire to resume sexual activity. Try to have someone help you with your household activities and your newborn for at least a few days after you leave the hospital. Rest as much as possible. Try to rest or  take a nap when your newborn is sleeping. Increase your activities gradually. Keep all of your scheduled postpartum appointments. It is very important to keep your scheduled follow-up appointments. At these appointments, your caregiver will be checking to make sure that you are healing physically and emotionally. SEEK MEDICAL CARE IF:  You are passing large clots from your vagina. Save any clots to show your caregiver. You have a foul smelling discharge from your vagina. You have trouble urinating. You are urinating frequently. You have pain when you urinate. You have a change in your bowel movements. You have increasing redness, pain, or swelling near your vaginal incision (episiotomy) or vaginal tear. You have pus draining from your episiotomy or vaginal tear. Your episiotomy or vaginal tear is separating. You have painful, hard, or reddened breasts. You have a severe headache. You have blurred vision or see spots. You feel sad or depressed. You have thoughts of hurting yourself or your newborn. You have questions about your care, the care of your newborn, or medicines. You are dizzy or light-headed. You have a rash. You have nausea or vomiting. You were breastfeeding and have not had a menstrual period within 12 weeks after you stopped breastfeeding. You are not breastfeeding and have not had a menstrual period by the 12th week after delivery. You have a fever. SEEK IMMEDIATE MEDICAL CARE IF:  You have persistent pain. You have chest pain. You have shortness of breath. You faint. You have leg pain. You have stomach pain. Your vaginal bleeding saturates two or more sanitary pads in 1 hour. MAKE SURE YOU:  Understand these instructions. Will watch your condition. Will get help right away if you are not doing well or   get worse. Document Released: 07/31/2000 Document Revised: 12/18/2013 Document Reviewed: 03/30/2012 ExitCare Patient Information 2015 [employer]. This  information is not intended to replace advice given to you by your health care provider. Make sure you discuss any questions you have with your health care provider.  Sitz Bath A sitz bath is a warm water bath taken in the sitting position. The water covers only the hips and butt (buttocks). We recommend using one that fits in the toilet, to help with ease of use and cleanliness. It may be used for either healing or cleaning purposes. Sitz baths are also used to relieve pain, itching, or muscle tightening (spasms). The water may contain medicine. Moist heat will help you heal and relax.  HOME CARE  Take 3 to 4 sitz baths a day. Fill the bathtub half-full with warm water. Sit in the water and open the drain a little. Turn on the warm water to keep the tub half-full. Keep the water running constantly. Soak in the water for 15 to 20 minutes. After the sitz bath, pat the affected area dry. GET HELP RIGHT AWAY IF: You get worse instead of better. Stop the sitz baths if you get worse. MAKE SURE YOU: Understand these instructions. Will watch your condition. Will get help right away if you are not doing well or get worse. Document Released: 09/10/2004 Document Revised: 04/27/2012 Document Reviewed: 12/01/2010 Exit[employer] Patient Information 2015 [employer]. This information is not intended to replace advice given to you by your health care provider. Make sure you discuss any questions you have with your health care provider.   

## 2022-03-07 NOTE — Discharge Summary (Signed)
Obstetrical Discharge Summary  Patient Name: Nancy Shaffer DOB: 03-11-1996 MRN: [id number]  Date of Admission: 03/06/2022 Date of Delivery: 03/06/22 Delivered by: Annamary Rummage CNM Date of Discharge: 03/08/2022  Primary OB: [facility]  [id number] last menstrual period was 06/09/2021 (exact date). EDC Estimated Date of Delivery: 03/16/22 Gestational Age at Delivery: [redacted]w[redacted]d   Antepartum complications:  1. Trichomonas 2. Pica 3. Obesity 4. Limited or no PNC 5. Anemia 6. GBS pos  Admitting Diagnosis: SROM and Labor Secondary Diagnosis: Patient Active Problem List   Diagnosis Date Noted   Vaginal discharge during pregnancy in third trimester 03/06/2022   Normal labor and delivery 03/06/2022   Encounter for suspected PROM, with rupture of membranes not found 02/27/2022   Bacterial vaginosis 02/27/2022   Group beta Strep positive 02/24/2022   Absolute anemia 01/27/2022   IDA (iron deficiency anemia) 01/27/2022   Pica ice 32 oz daily 12/10/2021   Noncompliant pregnant patient; no prenatal care x 7 wks 11/12/2021   Anemia in pregnancy 09/30/2021   Pap smear of cervix unsatisfactory 08/29/2021   Obesity affecting pregnancy BMI=36.0 08/27/2021   Supervision of high risk pregnancy, antepartum 08/27/2021   Trichomonal vaginitis during pregnancy 08/27/21 08/27/2021   Family history of sickle cell disease     Augmentation: N/A Complications: None  Intrapartum complications/course:  Delivery Type: spontaneous vaginal delivery Anesthesia: none Placenta: spontaneous Laceration: none Episiotomy: none Newborn Data: Live born female  Birth Weight:  6lbs 13oz APGAR: 9, 9  Newborn Delivery   Birth date/time: 03/06/2022 23:43:00 Delivery type: Vaginal, Spontaneous      26 y.o. G3P2002 at [redacted]w[redacted]d presenting with SROM and labor, AROM with clear fluid.  She progressed to complete and pushed over an intact perineum and delivered the fetal head, followed promptly by the  shoulders. She was in control the whole time, and the baby placed on the maternal abdomen. Delayed cord clamping and the FOB cut the baby's cord, while the baby was skin to skin. The placenta delivered spontaneously and intact. No lacerations noted. Mom and baby tolerated the procedure well.   Postpartum Procedures: None  Post partum course:  Patient had an uncomplicated postpartum course.  By time of discharge on PPD#2, her pain was controlled on oral pain medications; she had appropriate lochia and was ambulating, voiding without difficulty and tolerating regular diet.  She was deemed stable for discharge to home.    Discharge Physical Exam:  BP 107/78 (BP Location: Right Arm)   Pulse 76   Temp 98.1 F (36.7 C) (Oral)   Resp 17   Ht 5\' 4"  (1.626 m)   Wt 101.8 kg   LMP 06/09/2021 (Exact Date)   SpO2 100%   Breastfeeding Unknown   BMI 38.52 kg/m   General: alert and no distress Pulm: normal respiratory effort Lochia: appropriate Abdomen: soft, NT Uterine Fundus: firm, below umbilicus Perineum: Minimal edema/intact  Extremities: No evidence of DVT seen on physical exam. No lower extremity edema. Edinburgh:     03/07/2022    2:29 PM 09/07/2018    7:45 PM  Edinburgh Postnatal Depression Scale Screening Tool  I have been able to laugh and see the funny side of things. 0 0  I have looked forward with enjoyment to things. 0 0  I have blamed myself unnecessarily when things went wrong. 1 0  I have been anxious or worried for no good reason. 1 1  I have felt scared or panicky for no good reason. 0 0  Things have been  getting on top of me. 0 1  I have been so unhappy that I have had difficulty sleeping. 0 0  I have felt sad or miserable. 0 0  I have been so unhappy that I have been crying. 0 0  The thought of harming myself has occurred to me. 0 0  Edinburgh Postnatal Depression Scale Total 2 2     Labs:    Latest Ref Rng & Units 03/07/2022    5:38 AM 03/06/2022    9:41 AM  02/25/2022   11:58 AM  CBC  WBC 4.0 - 10.5 K/uL 18.3  12.1  9.4   Hemoglobin 12.0 - 15.0 g/dL 9.3  65.4  65.0   Hematocrit 36.0 - 46.0 % 29.6  36.6  34.0   Platelets 150 - 400 K/uL 325  372  320    A POS Hemoglobin  Date Value Ref Range Status  03/07/2022 9.3 (L) 12.0 - 15.0 g/dL Final  35/46/5681 27.5 (L) 11.1 - 15.9 g/dL Final   HCT  Date Value Ref Range Status  03/07/2022 29.6 (L) 36.0 - 46.0 % Final   Hematocrit  Date Value Ref Range Status  01/07/2022 30.8 (L) 34.0 - 46.6 % Final    Disposition: stable, discharge to home Baby Feeding: breastmilk and formula Baby Disposition: home with mom  Contraception: Liletta IUD   Prenatal Labs:  Blood type/Rh A pos  Antibody screen neg  Rubella Immune  Varicella Immune  RPR NR  HBsAg Neg  HIV NR  GC neg  Chlamydia neg  Genetic screening negative  1 hour GTT 108  3 hour GTT    GBS POS   Rh Immune globulin given: n/a Rubella vaccine given: Immune Varicella vaccine given: Immune Tdap vaccine given in AP or PP setting: 01/23/22 Flu vaccine given in AP or PP setting: 08/27/21  Plan: Nancy Shaffer was discharged to home in good condition.   Discharge Instructions: Per After Visit Summary. Activity: Advance as tolerated. Pelvic rest for 6 weeks.   Diet: Regular Discharge Medications: Allergies as of 03/08/2022   No Known Allergies      Medication List     TAKE these medications    acetaminophen 325 MG tablet Commonly known as: Tylenol Take 2 tablets (650 mg total) by mouth every 4 (four) hours as needed (for pain scale < 4).   ibuprofen 600 MG tablet Commonly known as: ADVIL Take 1 tablet (600 mg total) by mouth every 6 (six) hours.   Iron (Ferrous Sulfate) 325 (65 Fe) MG Tabs Take 1 tablet by mouth daily at 6 (six) AM.   prenatal multivitamin Tabs tablet Take 1 tablet by mouth daily at 12 noon.       Outpatient follow up:   Follow-up Information     Haroldine Laws, CNM. Schedule an  appointment as soon as possible for a visit in 6 week(s).   Specialty: Certified Nurse Midwife Why: postpartum visit and IUD insertion Contact information: [address] [place] [address] [phone]                 Signed: Margaretmary Eddy, CNM Certified Nurse Midwife [facility]

## 2022-03-08 MED ORDER — ACETAMINOPHEN 325 MG PO TABS: 650.0000 mg | ORAL_TABLET | ORAL | Status: AC | PRN

## 2022-03-08 MED ORDER — IBUPROFEN 600 MG PO TABS: 600.0000 mg | ORAL_TABLET | Freq: Four times a day (QID) | ORAL | 0 refills | Status: AC

## 2022-03-08 NOTE — Lactation Note (Signed)
This note was copied from a baby's chart. Lactation Consultation Note  Patient Name: Nancy Shaffer OILNZ'V Date: 03/08/2022 Reason for consult: Initial assessment;Early term 37-38.6wks Age:27 hours  Maternal Data Has patient been taught Hand Expression?: Yes Does the patient have breastfeeding experience prior to this delivery?: Yes How long did the patient breastfeed?: several months (always breast+bottle fed)  Mom has BF history with 48yr old and 76yr old daughters. She has breast/bottle fed with both. Mom is doing well, BF without difficulty, no pain/discomfort other than uterine cramping when feeding.  Feeding Mother's Current Feeding Choice: Breast Milk and Formula Nipple Type: Slow - flow  Mom will BF before giving formula. Offers formula if she feels that baby is not full from breast, or dad will provide formula if mom is resting.  LATCH Score   Did not BF during this visit.   Lactation Tools Discussed/Used    Interventions Interventions: Breast feeding basics reviewed;Hand express;Pre-pump if needed;Ice;DEBP;Hand pump;Education;Pace feeding  Reviewed milk supply and demand, normal course of lactation. Encourage to put baby to breast first before offering formula to encourage onset of adequate milk production. Reviewed paced feeding via bottle when offering formula/bottles to prevent over feeding. Reviewed early hunger cues, feeding on demand and with early cues.  Discharge Discharge Education: Engorgement and breast care;Warning signs for feeding baby;Outpatient recommendation  Guidance given for anticipated breast changes/fullness, ice for swelling, softening of breast tissue for deep latch, ensuring emptying of breast w/ each feeding.  Consult Status Consult Status: Complete  Outpatient lactation service information provided. Encouraged to call with questions and for ongoing BF support as needed.  Danford Bad 03/08/2022, 10:04 AM

## 2022-03-08 NOTE — Progress Notes (Signed)
Patient discharged with infant. Discharge instructions, prescriptions, and follow up appointments given to and reviewed with patient. Patient verbalized understanding. Escorted out by staff. 

## 2022-03-16 ENCOUNTER — Inpatient Hospital Stay: Admit: 2022-03-16 | Payer: Self-pay

## 2022-04-30 ENCOUNTER — Inpatient Hospital Stay: Payer: Medicaid Other | Attending: Oncology

## 2022-05-04 ENCOUNTER — Inpatient Hospital Stay: Payer: Medicaid Other | Admitting: Oncology

## 2023-10-02 ENCOUNTER — Encounter: Payer: Self-pay | Admitting: Oncology

## 2023-10-02 ENCOUNTER — Emergency Department
Admission: EM | Admit: 2023-10-02 | Discharge: 2023-10-02 | Disposition: A | Discharge status: Home / Self Care | Payer: 59 | Attending: Emergency Medicine | Admitting: Emergency Medicine

## 2023-10-02 ENCOUNTER — Other Ambulatory Visit: Payer: Self-pay

## 2023-10-02 ENCOUNTER — Emergency Department: Payer: 59

## 2023-10-02 DIAGNOSIS — W208XXA Other cause of strike by thrown, projected or falling object, initial encounter: Secondary | ICD-10-CM | POA: Diagnosis not present

## 2023-10-02 DIAGNOSIS — S9032XA Contusion of left foot, initial encounter: Secondary | ICD-10-CM | POA: Insufficient documentation

## 2023-10-02 DIAGNOSIS — S99922A Unspecified injury of left foot, initial encounter: Secondary | ICD-10-CM | POA: Diagnosis present

## 2023-10-02 NOTE — ED Provider Notes (Signed)
   [facility] Provider Note    Event Date/Time   First MD Initiated Contact with Patient 10/02/23 [phone]     (approximate)   History   Foot Pain   HPI  Nancy Shaffer is a 28 y.o. female who presents with complaints of left foot pain.  Patient reports book was accidentally dropped on her left foot last night.  She complains of pains and swelling     Physical Exam   Triage Vital Signs: ED Triage Vitals  Encounter Vitals Group     BP 10/02/23 0907 (!) 129/91     Systolic BP Percentile --      Diastolic BP Percentile --      Pulse Rate 10/02/23 0907 78     Resp 10/02/23 0907 16     Temp 10/02/23 0907 98.6 F (37 C)     Temp src --      SpO2 10/02/23 0907 98 %     Weight 10/02/23 0905 98 kg (216 lb)     Height 10/02/23 0905 1.626 m (5\' 4" )     Head Circumference --      Peak Flow --      Pain Score 10/02/23 0905 7     Pain Loc --      Pain Education --      Exclude from Growth Chart --     Most recent vital signs: Vitals:   10/02/23 0907  BP: (!) 129/91  Pulse: 78  Resp: 16  Temp: 98.6 F (37 C)  SpO2: 98%     General: Awake, no distress.  CV:  Good peripheral perfusion.  Resp:  Normal effort.  Abd:  No distention.  Other:  Swelling to the dorsum of the left foot distal metatarsal region   ED Results / Procedures / Treatments   Labs (all labs ordered are listed, but only abnormal results are displayed) Labs Reviewed - No data to display   EKG     RADIOLOGY Foot x-ray viewed interpret by me, no metatarsal fractures    PROCEDURES:  Critical Care performed:   Procedures   MEDICATIONS ORDERED IN ED: Medications - No data to display   IMPRESSION / MDM / ASSESSMENT AND PLAN / ED COURSE  I reviewed the triage vital signs and the nursing notes. Patient's presentation is most consistent with acute complicated illness / injury requiring diagnostic workup.   Patient presents with injury to the foot as  described above, differential includes contusion, metatarsal fracture  X-ray negative for fracture, recommend supportive care, RICE, outpatient follow-up as needed.       FINAL CLINICAL IMPRESSION(S) / ED DIAGNOSES   Final diagnoses:  Contusion of left foot, initial encounter     Rx / DC Orders   ED Discharge Orders     None        Note:  This document was prepared using Dragon voice recognition software and may include unintentional dictation errors.   Jene Every, MD 10/02/23 [phone]

## 2023-10-02 NOTE — ED Triage Notes (Signed)
 Pt to ED for left foot injury last night, states son dropped book shelf on it. Reports swelling and pain. Has been elevating and icing it

## 2023-11-22 ENCOUNTER — Encounter: Payer: Self-pay | Admitting: Oncology
